# Patient Record
Sex: Female | Born: 1945 | Race: White | Hispanic: No | Marital: Single
Health system: Southern US, Community
[De-identification: ages and names within clinical notes are randomized; demographics above are authoritative.]

---

## 2004-12-12 ENCOUNTER — Inpatient Hospital Stay: Payer: Self-pay | Admitting: Internal Medicine

## 2005-11-06 ENCOUNTER — Ambulatory Visit: Payer: Self-pay | Admitting: Cardiovascular Disease

## 2006-12-10 ENCOUNTER — Ambulatory Visit: Payer: Self-pay | Admitting: Internal Medicine

## 2007-05-05 ENCOUNTER — Ambulatory Visit: Payer: Self-pay | Admitting: Internal Medicine

## 2007-05-25 ENCOUNTER — Ambulatory Visit: Payer: Self-pay | Admitting: Internal Medicine

## 2008-01-05 ENCOUNTER — Ambulatory Visit: Payer: Self-pay | Admitting: Cardiovascular Disease

## 2008-03-23 ENCOUNTER — Emergency Department: Payer: Self-pay | Admitting: Emergency Medicine

## 2008-03-23 ENCOUNTER — Other Ambulatory Visit: Payer: Self-pay

## 2009-07-06 ENCOUNTER — Emergency Department: Payer: Self-pay | Admitting: Emergency Medicine

## 2009-09-15 ENCOUNTER — Emergency Department: Payer: Self-pay | Admitting: Emergency Medicine

## 2009-09-22 ENCOUNTER — Emergency Department: Payer: Self-pay | Admitting: Emergency Medicine

## 2009-11-08 ENCOUNTER — Emergency Department: Payer: Self-pay | Admitting: Emergency Medicine

## 2009-12-13 ENCOUNTER — Ambulatory Visit: Payer: Self-pay | Admitting: Internal Medicine

## 2010-01-02 ENCOUNTER — Inpatient Hospital Stay: Payer: Self-pay | Admitting: Internal Medicine

## 2010-02-10 ENCOUNTER — Ambulatory Visit: Payer: Self-pay | Admitting: Internal Medicine

## 2010-05-07 ENCOUNTER — Emergency Department: Payer: Self-pay | Admitting: Emergency Medicine

## 2010-06-07 ENCOUNTER — Emergency Department: Payer: Self-pay | Admitting: Emergency Medicine

## 2010-06-09 ENCOUNTER — Inpatient Hospital Stay: Payer: Self-pay | Admitting: Specialist

## 2010-10-08 ENCOUNTER — Inpatient Hospital Stay: Payer: Self-pay | Admitting: Internal Medicine

## 2010-10-19 ENCOUNTER — Emergency Department: Payer: Self-pay | Admitting: Emergency Medicine

## 2011-04-01 ENCOUNTER — Inpatient Hospital Stay: Payer: Self-pay | Admitting: Internal Medicine

## 2011-04-05 ENCOUNTER — Inpatient Hospital Stay: Payer: Self-pay | Admitting: Internal Medicine

## 2011-05-16 ENCOUNTER — Inpatient Hospital Stay: Payer: Self-pay | Admitting: *Deleted

## 2011-06-04 ENCOUNTER — Inpatient Hospital Stay: Payer: Self-pay | Admitting: Internal Medicine

## 2011-07-21 ENCOUNTER — Emergency Department: Payer: Self-pay | Admitting: Emergency Medicine

## 2011-08-28 ENCOUNTER — Emergency Department: Payer: Self-pay | Admitting: Emergency Medicine

## 2011-11-23 ENCOUNTER — Observation Stay: Payer: Self-pay | Admitting: Internal Medicine

## 2011-12-28 IMAGING — CR DG HIP COMPLETE 2+V*L*
1 series · 2 of 2 positions shown · non-contrast
Comparison: none

REASON FOR EXAM: fall injury
COMMENTS:

[Series 1: view not recorded · 0.17mm/px · 2 of 2 slices shown]
[im 1/2]
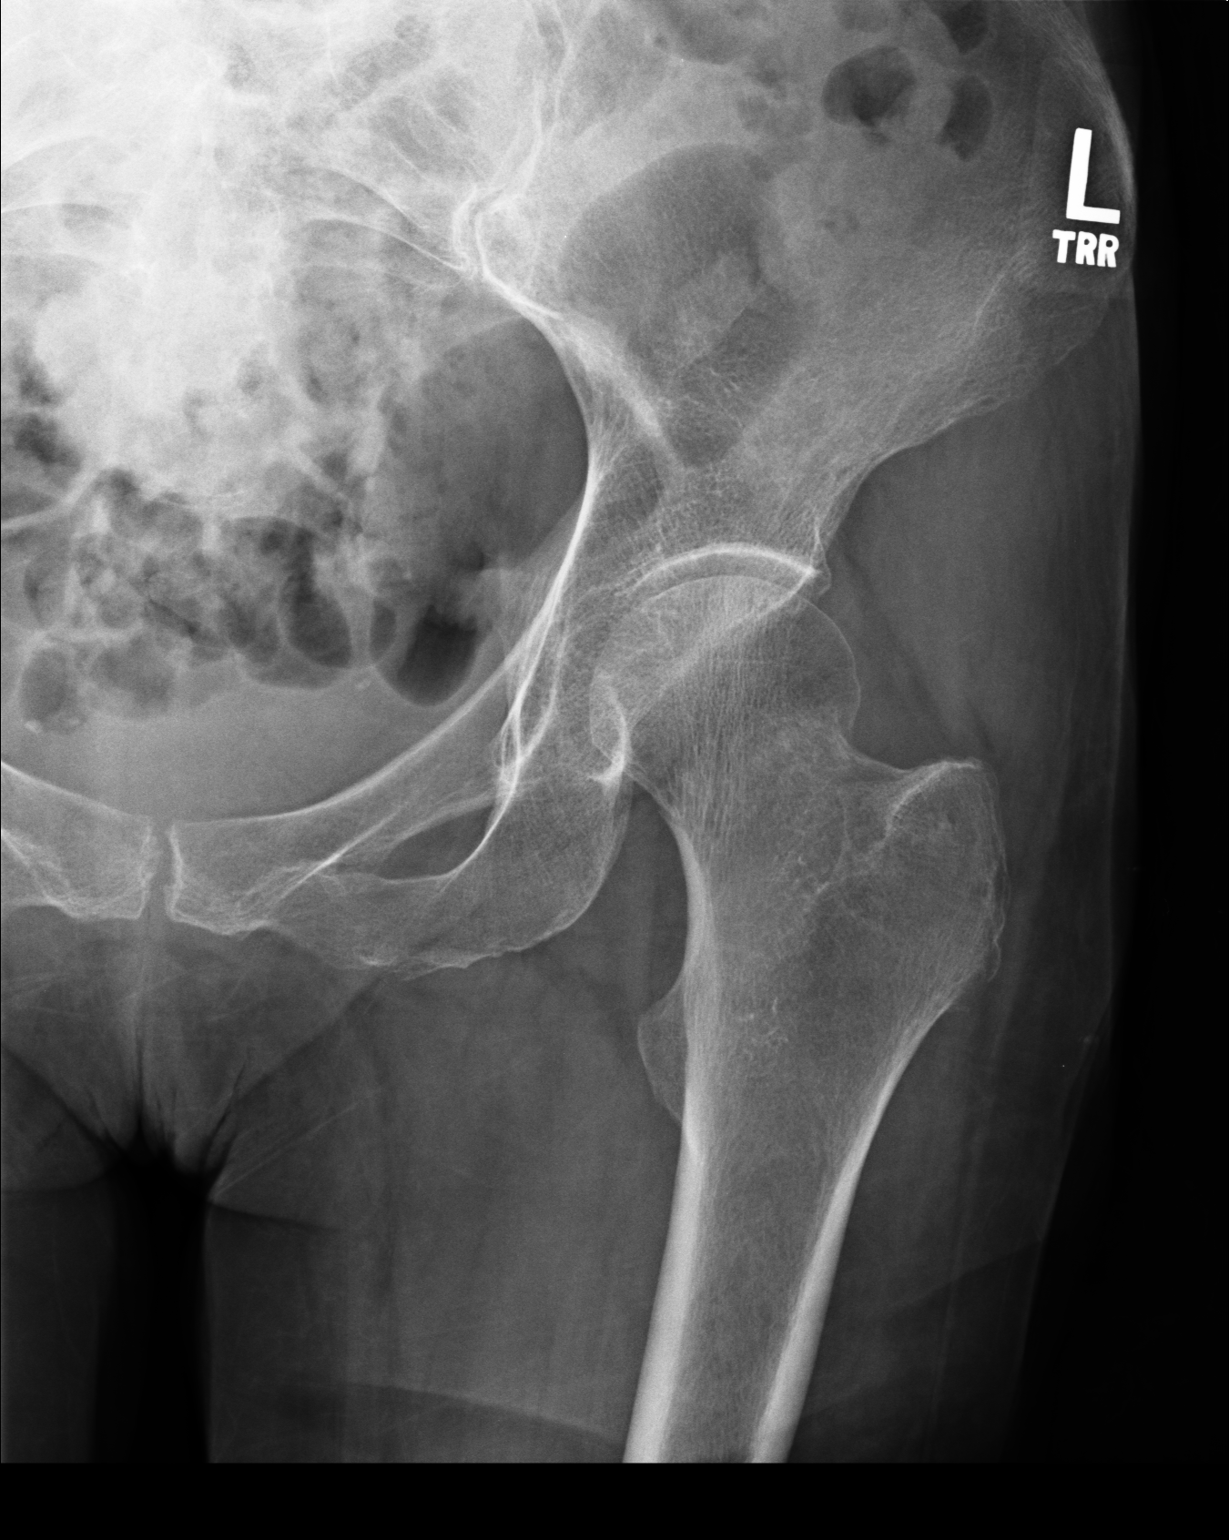
[im 2/2]
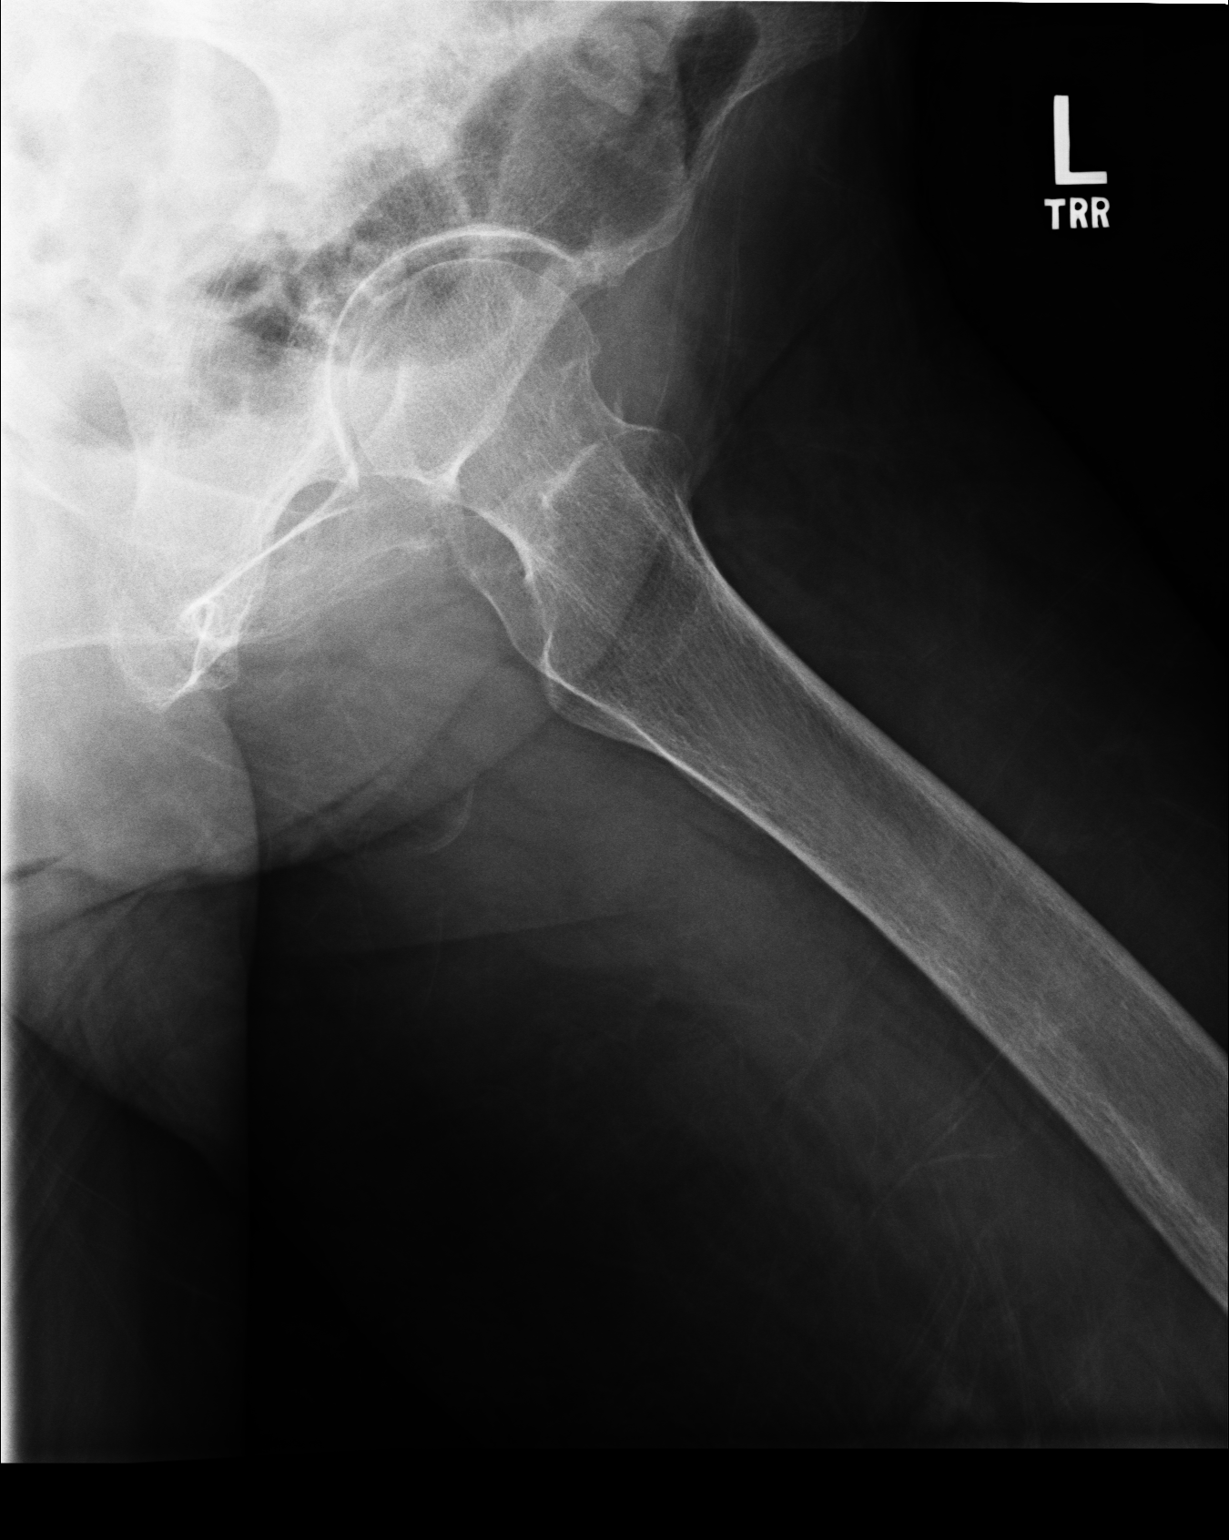

[2 of 2 positions shown; findings below may reference images not displayed]

PROCEDURE:     DXR - DXR HIP LEFT COMPLETE  - June 04, 2011 [DATE]

RESULT:     There is slight irregularity of the subcapital portion of the
femoral neck. The possibility of a minimal impaction-type fracture cannot be
excluded. If symptomatology persists, followup examination by MR or bone
scan is recommended. The hip joint space is well-maintained.
IMPRESSION: Possible nondisplaced femoral neck fracture. Followup examination is
recommended if symptomatology persists.

## 2011-12-28 IMAGING — CR DG LUMBAR SPINE 2-3V
1 series · 3 of 3 positions shown · non-contrast
Comparison: none

REASON FOR EXAM: fracture
COMMENTS:

[Series 1: view not recorded · 0.17mm/px · 3 of 3 slices shown]
[im 1/3]
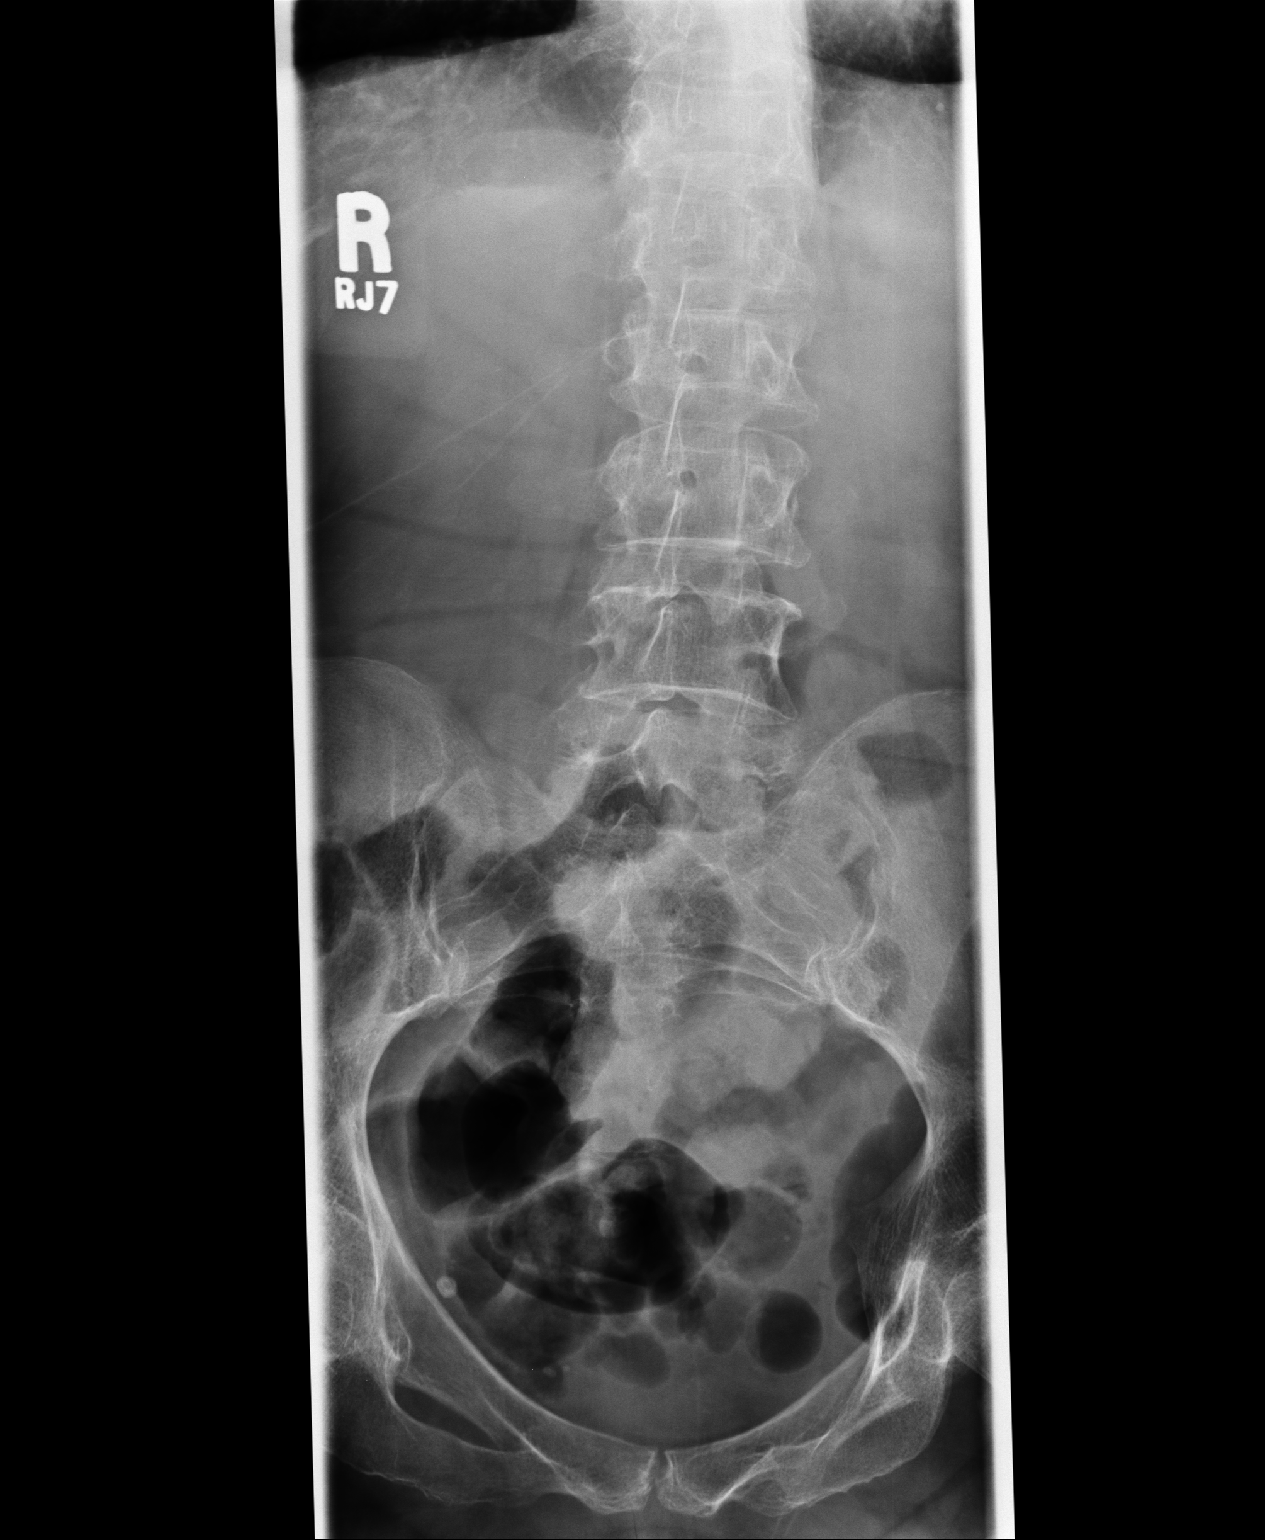
[im 2/3]
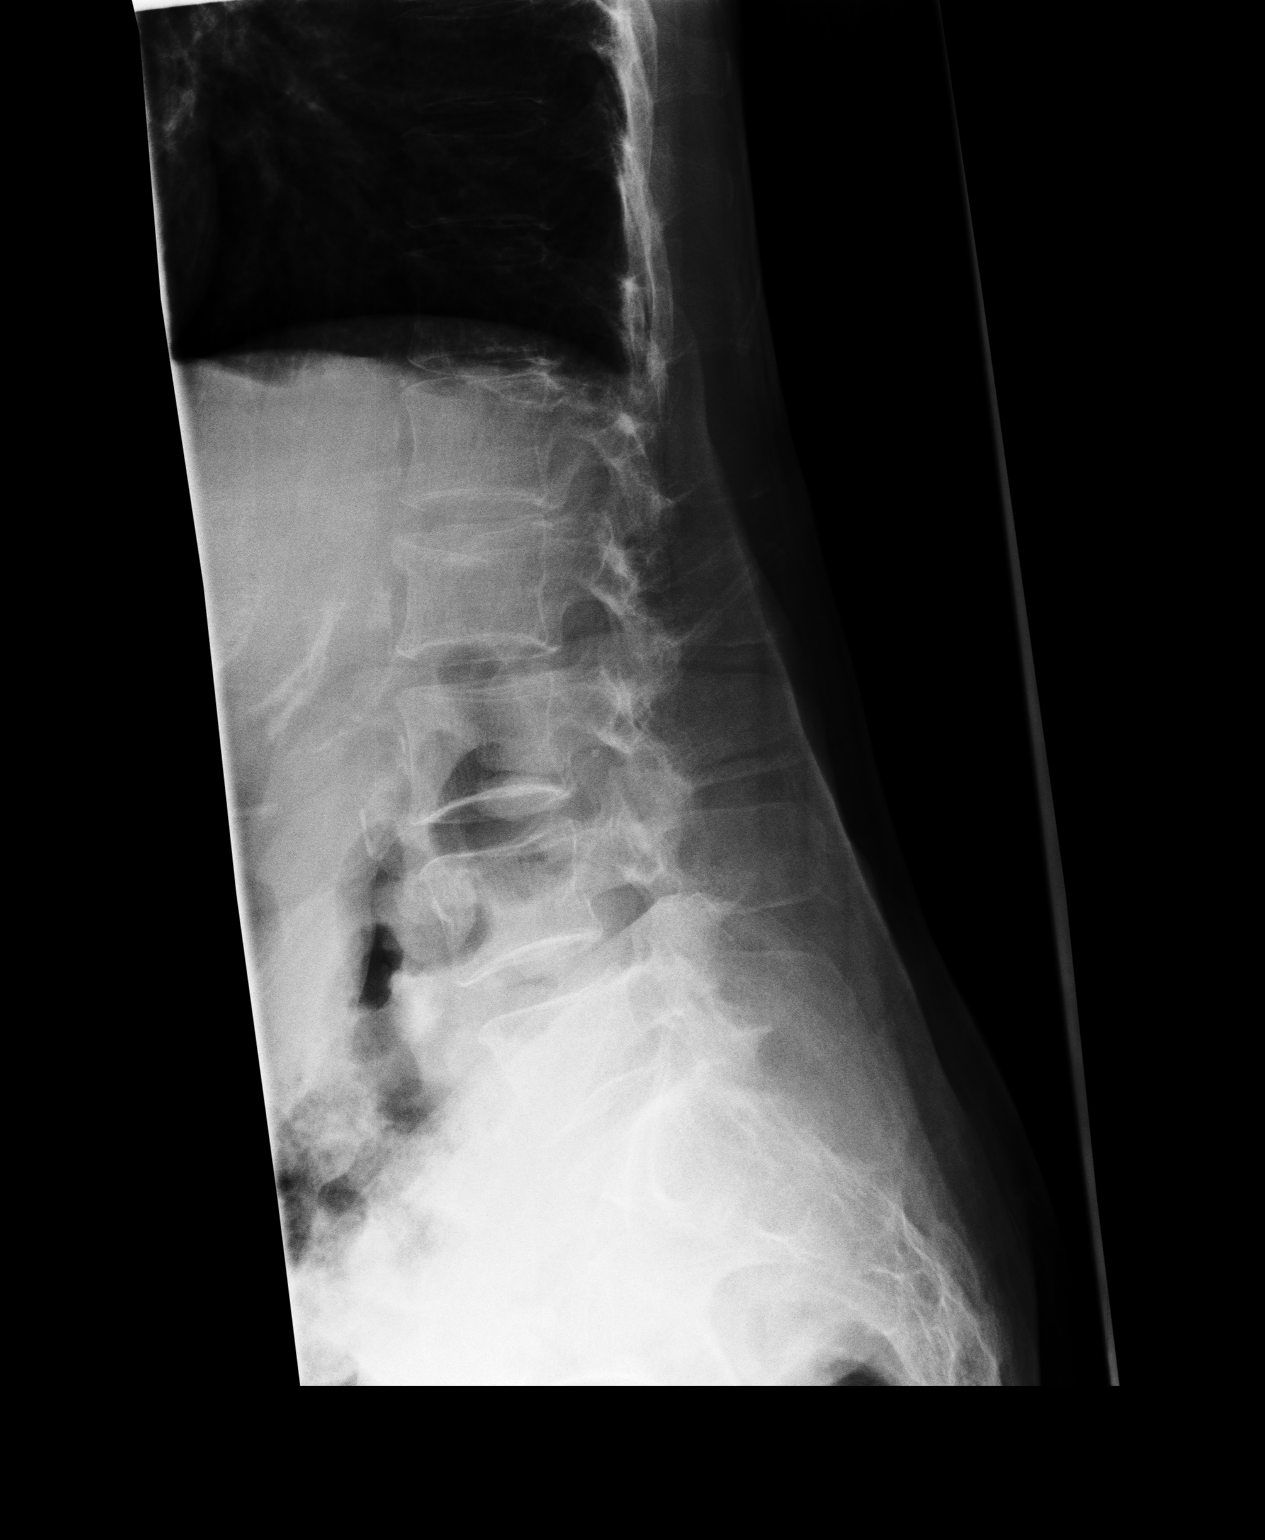
[im 3/3]
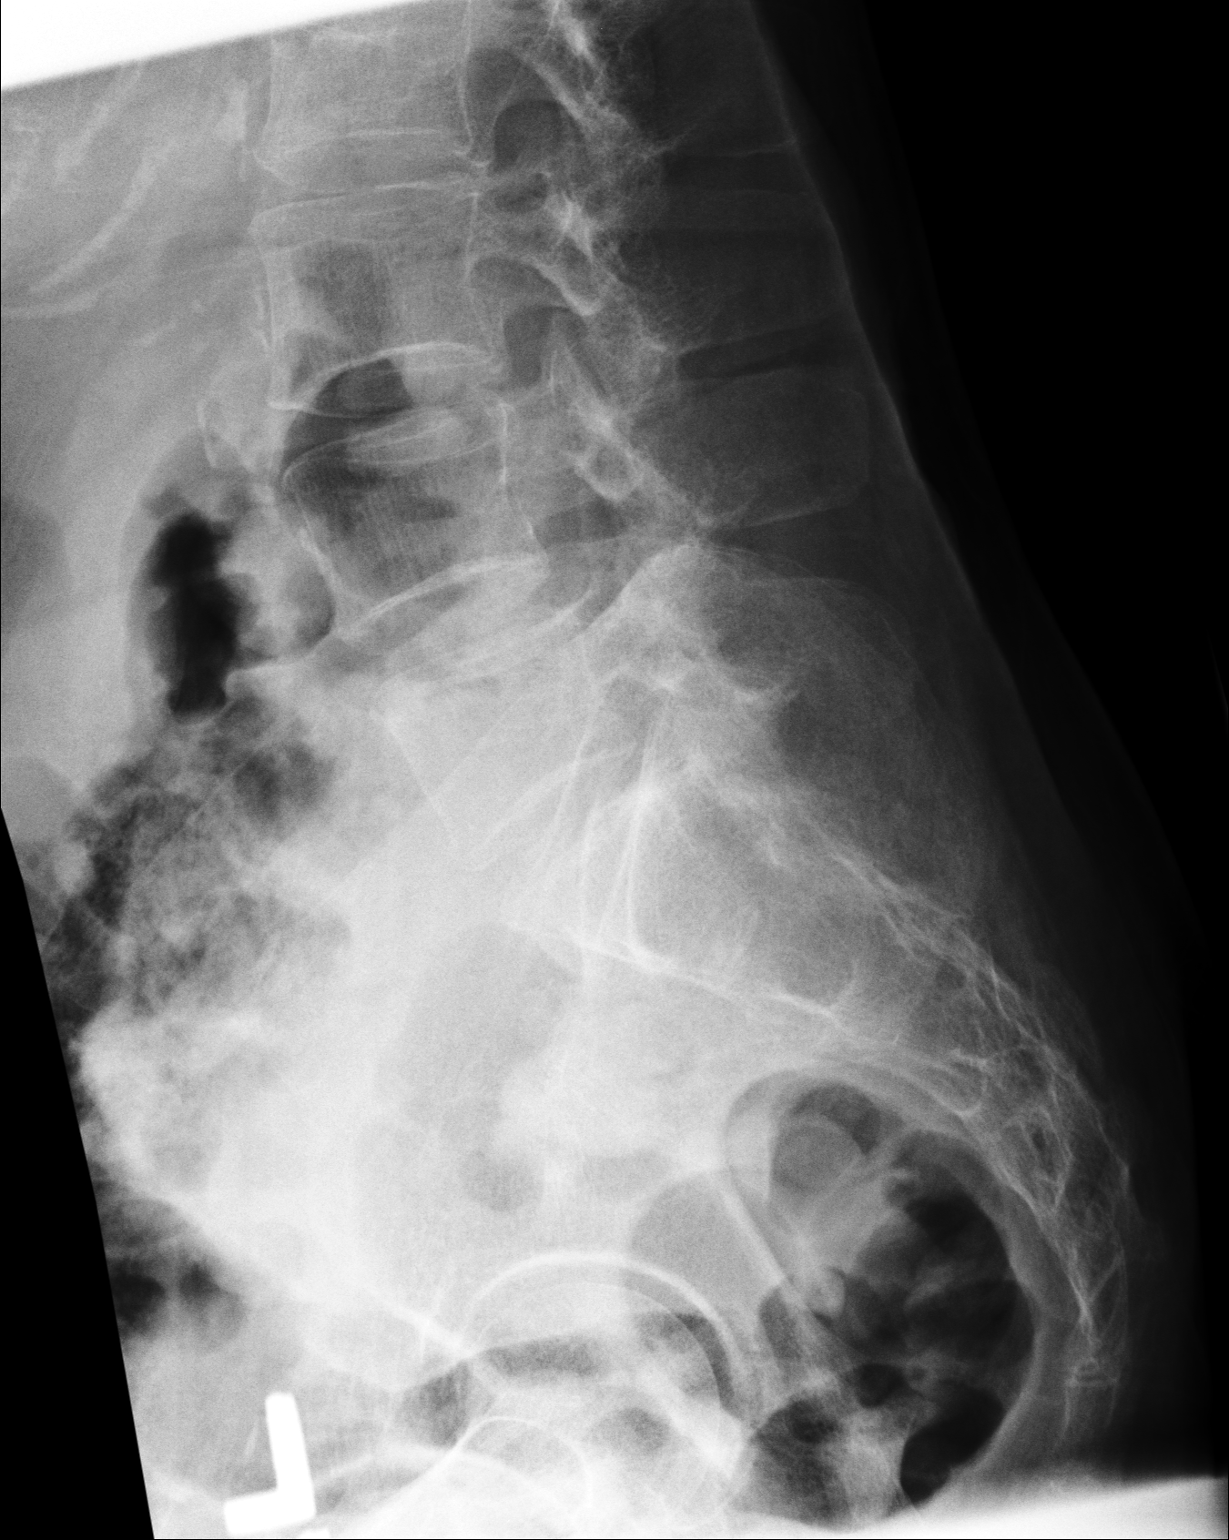

[3 of 3 positions shown; findings below may reference images not displayed]

PROCEDURE:     DXR - DXR LUMBAR SPINE AP AND LATERAL  - June 04, 2011  [DATE]

RESULT:     Comparison is made to a prior exam of 05/05/2007. The current
exam shows slight irregularity of the anterior/superior margin of L2. The
possibility of a minimal compression fracture at L2 cannot be excluded on
the basis of this exam. This could be further evaluated by lumbar CT if such
is clinically indicated. The vertebral body heights otherwise are
well-maintained. The vertebral body alignment is normal. The intervertebral
disc spaces are normal in appearance. The pedicles are bilaterally intact.
IMPRESSION: Possible slight compression deformity at L2. This could be better evaluated
by CT if such is clinically indicated.

## 2012-01-20 ENCOUNTER — Inpatient Hospital Stay: Payer: Self-pay | Admitting: Internal Medicine

## 2012-01-20 LAB — BASIC METABOLIC PANEL
Anion Gap: 4 — ABNORMAL LOW (ref 7–16)
BUN: 10 mg/dL (ref 7–18)
Calcium, Total: 8.9 mg/dL (ref 8.5–10.1)
Chloride: 103 mmol/L (ref 98–107)
Co2: 32 mmol/L (ref 21–32)
Creatinine: 0.62 mg/dL (ref 0.60–1.30)
Glucose: 86 mg/dL (ref 65–99)
Osmolality: 276 (ref 275–301)
Potassium: 4.2 mmol/L (ref 3.5–5.1)
Sodium: 139 mmol/L (ref 136–145)

## 2012-01-20 LAB — CBC WITH DIFFERENTIAL/PLATELET
Basophil #: 0.1 10*3/uL (ref 0.0–0.1)
Basophil %: 0.8 %
Eosinophil #: 0.1 10*3/uL (ref 0.0–0.7)
Eosinophil %: 1.7 %
HCT: 38.1 % (ref 35.0–47.0)
HGB: 12.8 g/dL (ref 12.0–16.0)
Lymphocyte #: 2.6 10*3/uL (ref 1.0–3.6)
Lymphocyte %: 41.4 %
MCH: 32 pg (ref 26.0–34.0)
MCHC: 33.6 g/dL (ref 32.0–36.0)
MCV: 95 fL (ref 80–100)
Monocyte #: 0.6 10*3/uL (ref 0.0–0.7)
Monocyte %: 8.8 %
Neutrophil #: 2.9 10*3/uL (ref 1.4–6.5)
Neutrophil %: 47.3 %
Platelet: 293 10*3/uL (ref 150–440)
RBC: 4.01 10*6/uL (ref 3.80–5.20)
RDW: 14.2 % (ref 11.5–14.5)
WBC: 6.2 10*3/uL (ref 3.6–11.0)

## 2012-01-20 LAB — THEOPHYLLINE LEVEL: Theophylline: <2 ug/mL — ABNORMAL LOW (ref 10.0–20.0)

## 2012-01-21 LAB — URINALYSIS, COMPLETE
Bacteria: NONE SEEN
Blood: NEGATIVE
Glucose,UR: NEGATIVE mg/dL (ref 0–75)
Leukocyte Esterase: NEGATIVE
Nitrite: NEGATIVE
Ph: 6 (ref 4.5–8.0)
Protein: NEGATIVE
Specific Gravity: 1.018 (ref 1.003–1.030)
WBC UR: <1 /HPF (ref 0–5)

## 2012-01-26 LAB — CULTURE, BLOOD (SINGLE)

## 2012-02-26 ENCOUNTER — Inpatient Hospital Stay: Payer: Self-pay | Admitting: Internal Medicine

## 2012-02-26 LAB — CBC
HGB: 12.8 g/dL (ref 12.0–16.0)
MCH: 31.5 pg (ref 26.0–34.0)
MCV: 95 fL (ref 80–100)
Platelet: 215 10*3/uL (ref 150–440)
RBC: 4.06 10*6/uL (ref 3.80–5.20)
WBC: 3.9 10*3/uL (ref 3.6–11.0)

## 2012-02-26 LAB — COMPREHENSIVE METABOLIC PANEL
Albumin: 3.6 g/dL (ref 3.4–5.0)
Anion Gap: 7 (ref 7–16)
BUN: 6 mg/dL — ABNORMAL LOW (ref 7–18)
Calcium, Total: 8.9 mg/dL (ref 8.5–10.1)
Chloride: 104 mmol/L (ref 98–107)
Co2: 32 mmol/L (ref 21–32)
EGFR (Non-African Amer.): >60
Potassium: 4.4 mmol/L (ref 3.5–5.1)
SGOT(AST): 15 U/L (ref 15–37)
SGPT (ALT): 15 U/L
Sodium: 143 mmol/L (ref 136–145)
Total Protein: 6.3 g/dL — ABNORMAL LOW (ref 6.4–8.2)

## 2012-02-26 LAB — MAGNESIUM: Magnesium: 1.8 mg/dL

## 2012-02-26 LAB — THEOPHYLLINE LEVEL: Theophylline: <2 ug/mL — ABNORMAL LOW (ref 10.0–20.0)

## 2012-02-28 LAB — CBC WITH DIFFERENTIAL/PLATELET
Basophil #: 0 10*3/uL (ref 0.0–0.1)
Basophil %: 0.3 %
Eosinophil %: 0 %
HCT: 34.9 % — ABNORMAL LOW (ref 35.0–47.0)
HGB: 11.7 g/dL — ABNORMAL LOW (ref 12.0–16.0)
Lymphocyte %: 5.7 %
MCHC: 33.5 g/dL (ref 32.0–36.0)
MCV: 94 fL (ref 80–100)
Neutrophil #: 11.4 10*3/uL — ABNORMAL HIGH (ref 1.4–6.5)
Neutrophil %: 92.2 %
RBC: 3.72 10*6/uL — ABNORMAL LOW (ref 3.80–5.20)
RDW: 13.6 % (ref 11.5–14.5)

## 2012-02-28 LAB — MAGNESIUM: Magnesium: 1.7 mg/dL — ABNORMAL LOW

## 2012-02-29 LAB — BASIC METABOLIC PANEL
Anion Gap: 9 (ref 7–16)
BUN: 13 mg/dL (ref 7–18)
Calcium, Total: 8.5 mg/dL (ref 8.5–10.1)
Chloride: 102 mmol/L (ref 98–107)
Creatinine: 0.59 mg/dL — ABNORMAL LOW (ref 0.60–1.30)
EGFR (African American): >60
EGFR (Non-African Amer.): >60
Glucose: 131 mg/dL — ABNORMAL HIGH (ref 65–99)
Potassium: 4.4 mmol/L (ref 3.5–5.1)

## 2012-02-29 LAB — CBC WITH DIFFERENTIAL/PLATELET
Basophil %: 0.1 %
Eosinophil #: 0 10*3/uL (ref 0.0–0.7)
Eosinophil %: 0 %
HGB: 12.1 g/dL (ref 12.0–16.0)
Lymphocyte %: 5.1 %
MCV: 95 fL (ref 80–100)
Monocyte #: 0.2 10*3/uL (ref 0.0–0.7)
Neutrophil #: 12.7 10*3/uL — ABNORMAL HIGH (ref 1.4–6.5)
Neutrophil %: 93.2 %
RBC: 3.91 10*6/uL (ref 3.80–5.20)
RDW: 13.3 % (ref 11.5–14.5)
WBC: 13.7 10*3/uL — ABNORMAL HIGH (ref 3.6–11.0)

## 2012-02-29 LAB — MAGNESIUM: Magnesium: 1.9 mg/dL

## 2012-03-01 LAB — LIPID PANEL
Cholesterol: 159 mg/dL (ref 0–200)
Ldl Cholesterol, Calc: 72 mg/dL (ref 0–100)
Triglycerides: 99 mg/dL (ref 0–200)
VLDL Cholesterol, Calc: 20 mg/dL (ref 5–40)

## 2012-03-01 LAB — WBC: WBC: 8.8 10*3/uL (ref 3.6–11.0)

## 2012-05-29 ENCOUNTER — Emergency Department: Payer: Self-pay | Admitting: Internal Medicine

## 2012-05-29 LAB — BASIC METABOLIC PANEL
Calcium, Total: 9 mg/dL (ref 8.5–10.1)
Chloride: 104 mmol/L (ref 98–107)
Co2: 28 mmol/L (ref 21–32)
Creatinine: 0.77 mg/dL (ref 0.60–1.30)
EGFR (African American): >60
EGFR (Non-African Amer.): >60
Glucose: 85 mg/dL (ref 65–99)
Osmolality: 282 (ref 275–301)
Potassium: 3.6 mmol/L (ref 3.5–5.1)

## 2012-05-29 LAB — HEPATIC FUNCTION PANEL A (ARMC)
Alkaline Phosphatase: 74 U/L (ref 50–136)
SGOT(AST): 17 U/L (ref 15–37)
SGPT (ALT): 17 U/L

## 2012-05-29 LAB — TROPONIN I: Troponin-I: <0.02 ng/mL

## 2012-05-29 LAB — CBC
HGB: 14.6 g/dL (ref 12.0–16.0)
MCH: 31.1 pg (ref 26.0–34.0)
MCHC: 33.6 g/dL (ref 32.0–36.0)
Platelet: 304 10*3/uL (ref 150–440)
RBC: 4.69 10*6/uL (ref 3.80–5.20)

## 2012-06-20 ENCOUNTER — Ambulatory Visit: Payer: Self-pay | Admitting: Internal Medicine

## 2012-11-17 ENCOUNTER — Ambulatory Visit: Payer: Self-pay

## 2012-12-06 ENCOUNTER — Emergency Department: Payer: Self-pay | Admitting: Emergency Medicine

## 2012-12-06 LAB — BASIC METABOLIC PANEL
Anion Gap: 3 — ABNORMAL LOW (ref 7–16)
BUN: 16 mg/dL (ref 7–18)
Calcium, Total: 8.5 mg/dL (ref 8.5–10.1)
Creatinine: 0.69 mg/dL (ref 0.60–1.30)
EGFR (Non-African Amer.): >60
Osmolality: 280 (ref 275–301)
Potassium: 3.9 mmol/L (ref 3.5–5.1)

## 2012-12-06 LAB — CBC
HGB: 12.3 g/dL (ref 12.0–16.0)
MCH: 31 pg (ref 26.0–34.0)
MCHC: 33.4 g/dL (ref 32.0–36.0)
MCV: 93 fL (ref 80–100)
Platelet: 195 10*3/uL (ref 150–440)
RBC: 3.98 10*6/uL (ref 3.80–5.20)
RDW: 14.1 % (ref 11.5–14.5)
WBC: 7.8 10*3/uL (ref 3.6–11.0)

## 2012-12-06 LAB — CK TOTAL AND CKMB (NOT AT ARMC)
CK, Total: 159 U/L (ref 21–215)
CK, Total: 139 U/L (ref 21–215)

## 2012-12-06 LAB — TROPONIN I: Troponin-I: <0.02 ng/mL

## 2012-12-22 IMAGING — CR DG CHEST 2V
1 series · 2 of 2 positions shown · non-contrast
Comparison: none

REASON FOR EXAM: SOB
COMMENTS:

[Series 1: pa · 0.17mm/px · 2 of 2 slices shown]
[im 1/2]
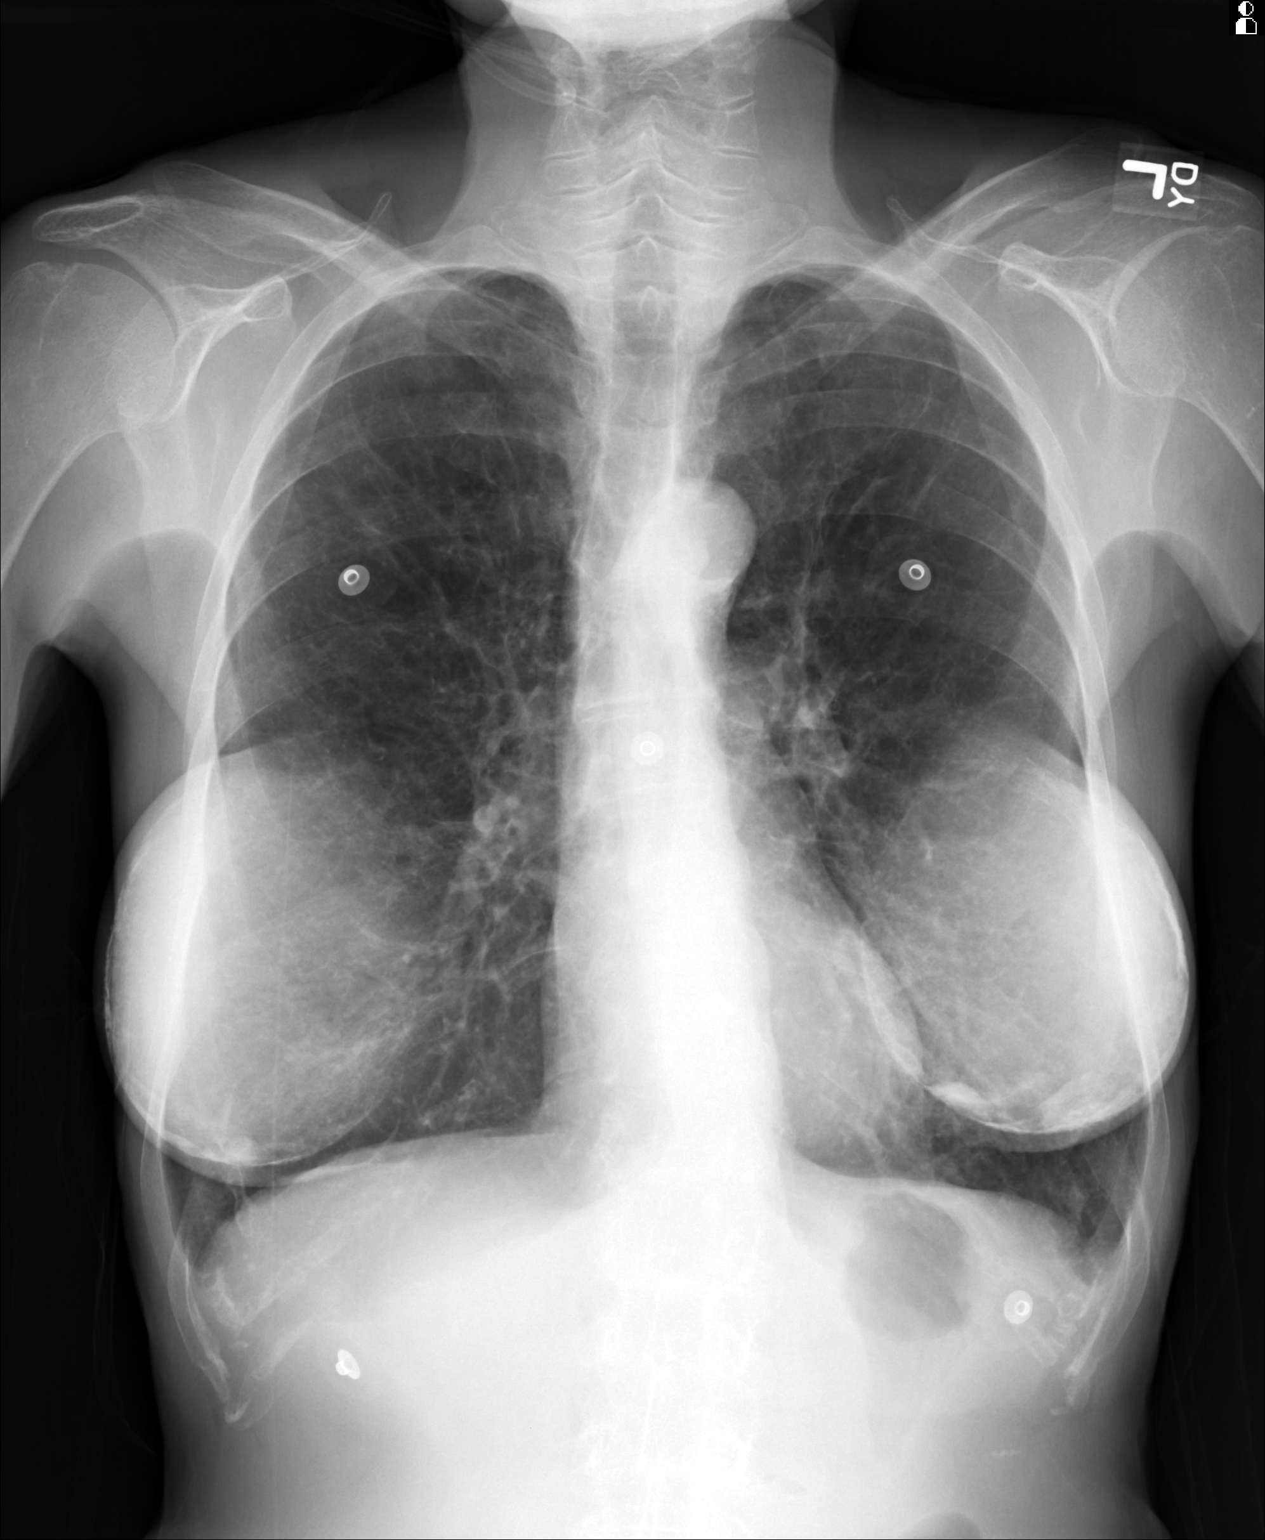
[im 2/2]
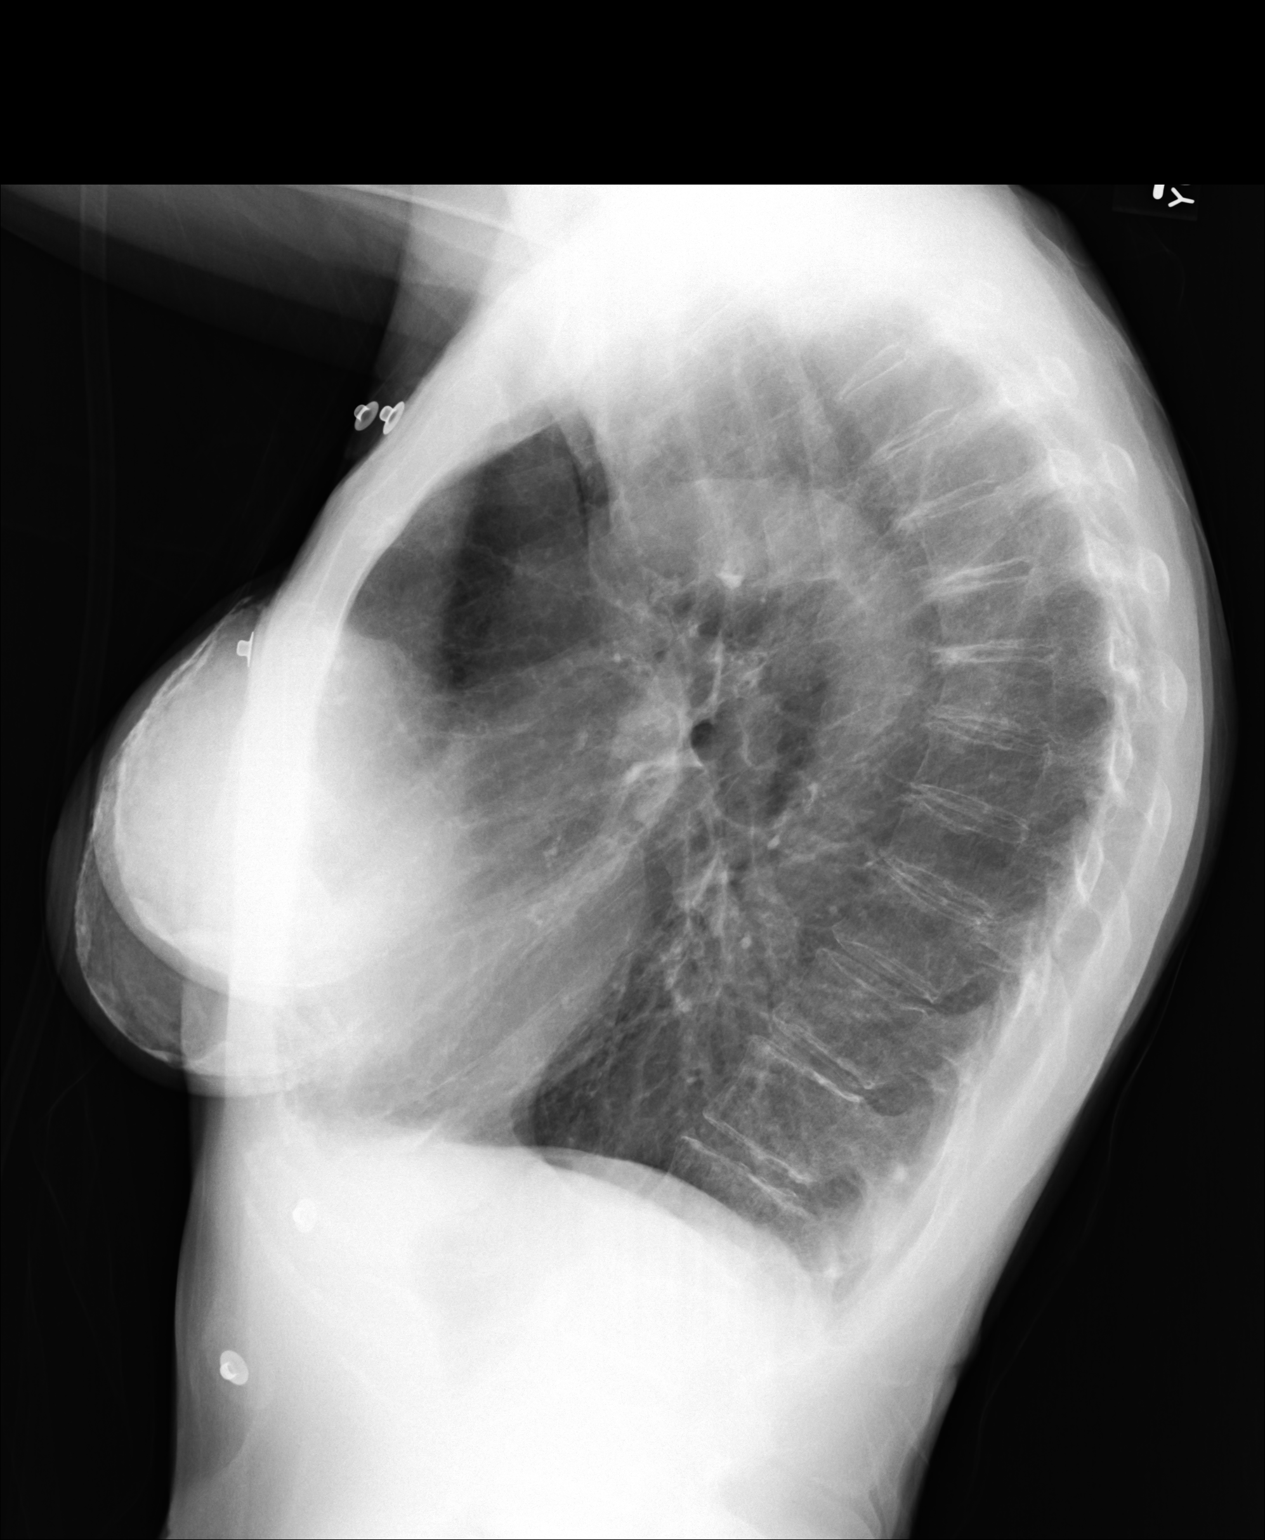

[2 of 2 positions shown; findings below may reference images not displayed]

PROCEDURE:     DXR - DXR CHEST PA (OR AP) AND LATERAL  - May 29, 2012  [DATE]

RESULT:     Comparison is made to the study 26 February, 2012.

Old calcified breast implants are present. The lungs are hyperinflated
consistent with COPD. Underlying chronic inflammatory or fibrotic changes
are present without edema, infiltrate, effusion or pneumothorax. The heart
size is normal.
IMPRESSION: 1. COPD.
2. Chronic inflammatory changes without acute cardiopulmonary disease
evident.

[REDACTED]

## 2013-01-01 LAB — BASIC METABOLIC PANEL
Anion Gap: 2 — ABNORMAL LOW (ref 7–16)
BUN: 10 mg/dL (ref 7–18)
Calcium, Total: 8.1 mg/dL — ABNORMAL LOW (ref 8.5–10.1)
Co2: 32 mmol/L (ref 21–32)
Creatinine: 0.71 mg/dL (ref 0.60–1.30)
EGFR (Non-African Amer.): >60
Glucose: 93 mg/dL (ref 65–99)
Potassium: 3.9 mmol/L (ref 3.5–5.1)

## 2013-01-01 LAB — CBC
HGB: 10.7 g/dL — ABNORMAL LOW (ref 12.0–16.0)
MCH: 30.4 pg (ref 26.0–34.0)
Platelet: 177 10*3/uL (ref 150–440)
RDW: 14.6 % — ABNORMAL HIGH (ref 11.5–14.5)

## 2013-01-02 ENCOUNTER — Observation Stay: Payer: Self-pay | Admitting: Internal Medicine

## 2013-01-05 LAB — PLATELET COUNT: Platelet: 182 10*3/uL (ref 150–440)

## 2013-01-07 ENCOUNTER — Ambulatory Visit: Payer: Self-pay | Admitting: Internal Medicine

## 2013-01-08 ENCOUNTER — Observation Stay: Payer: Self-pay | Admitting: Internal Medicine

## 2013-01-08 LAB — CBC
HCT: 35.8 % (ref 35.0–47.0)
HGB: 12.1 g/dL (ref 12.0–16.0)
MCHC: 33.6 g/dL (ref 32.0–36.0)
MCV: 92 fL (ref 80–100)
RBC: 3.88 10*6/uL (ref 3.80–5.20)
RDW: 14.1 % (ref 11.5–14.5)

## 2013-01-08 LAB — BASIC METABOLIC PANEL
BUN: 18 mg/dL (ref 7–18)
Calcium, Total: 8 mg/dL — ABNORMAL LOW (ref 8.5–10.1)
Chloride: 104 mmol/L (ref 98–107)
Creatinine: 0.7 mg/dL (ref 0.60–1.30)
EGFR (African American): >60
Potassium: 3.4 mmol/L — ABNORMAL LOW (ref 3.5–5.1)
Sodium: 140 mmol/L (ref 136–145)

## 2013-01-08 LAB — TROPONIN I: Troponin-I: <0.02 ng/mL

## 2013-01-08 LAB — CK TOTAL AND CKMB (NOT AT ARMC): CK, Total: 53 U/L (ref 21–215)

## 2013-01-09 LAB — LIPID PANEL
Cholesterol: 133 mg/dL (ref 0–200)
Triglycerides: 55 mg/dL (ref 0–200)

## 2013-01-13 IMAGING — CT CT HEAD WITHOUT CONTRAST
1 series · 16 of 30 positions shown, 20 images · non-contrast
Comparison: none

REASON FOR EXAM: CR [id number] Syncope Dizziness Left Sided Weakness
COMMENTS:

PROCEDURE:     KCT - KCT HEAD WITHOUT CONTRAST  - June 20, 2012  [DATE]
RESULT:     The thecal syncope.
Comparison Study: Prior CT of 01/10/2010.

[Series 2: 1 soft tissue · axial · 0.39mm/px · z∈[-38,+102]mm · 16 of 32 slices shown, 20 images]
[im 2/32  brain]
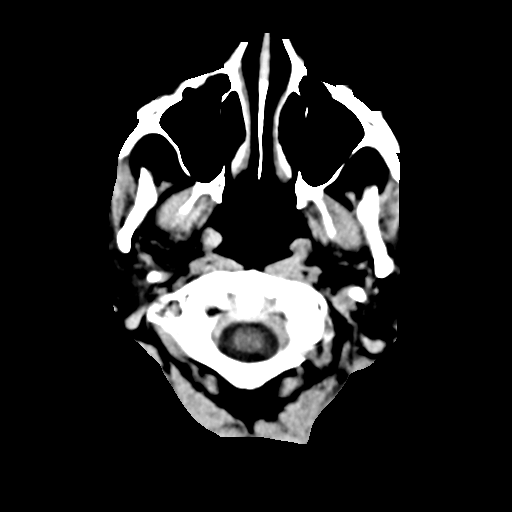
[im 2/32  bone]
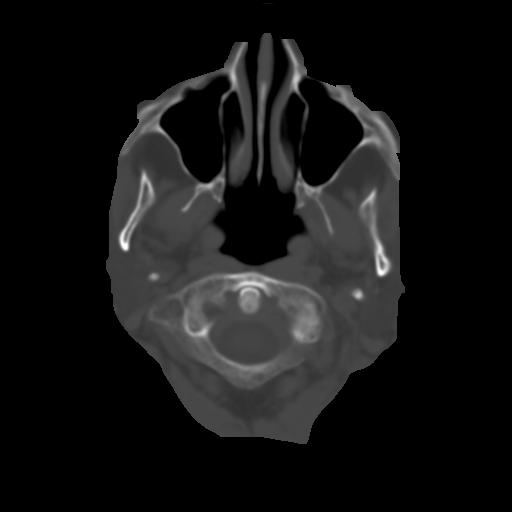
[im 4/32  brain]
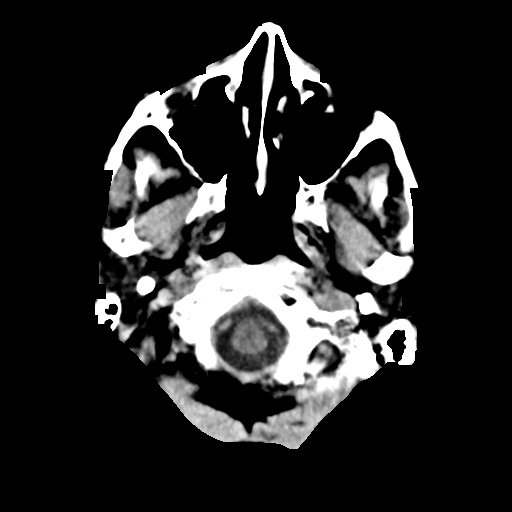
[im 6/32  brain]
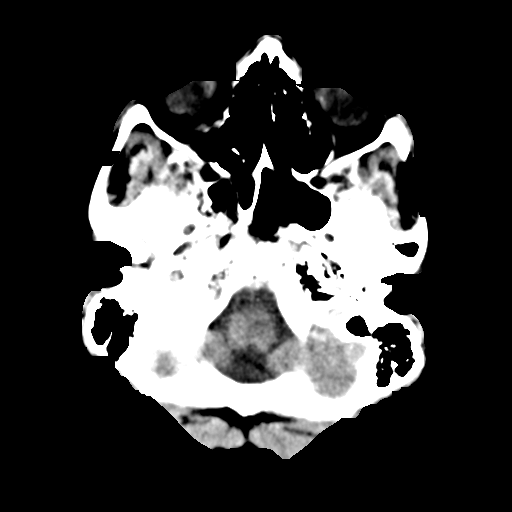
[im 8/32  brain]
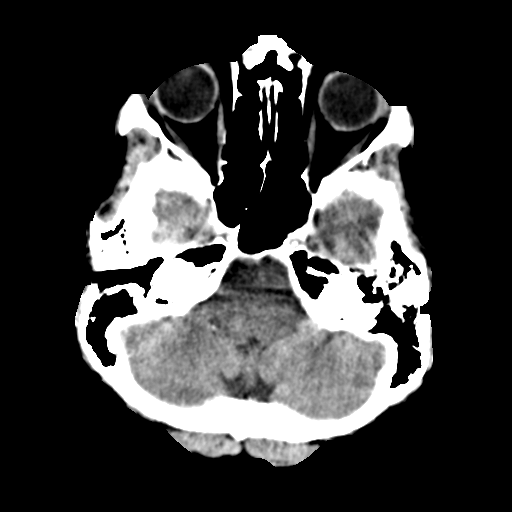
[im 9/32  brain]
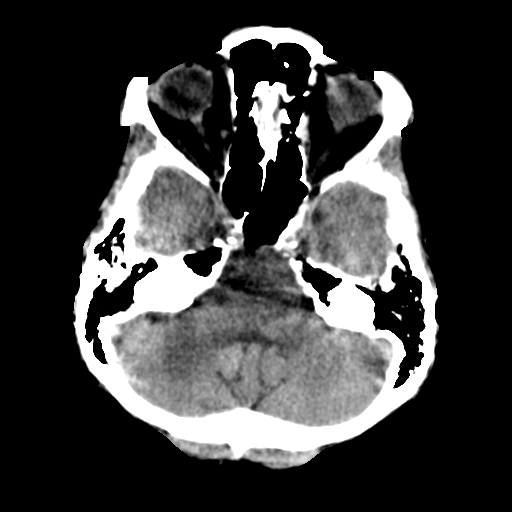
[im 9/32  bone]
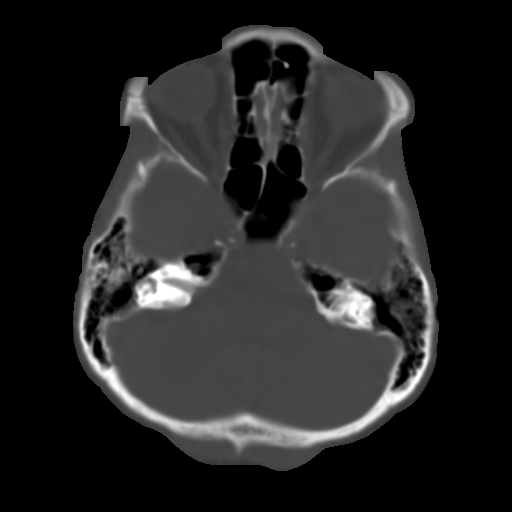
[im 11/32  brain]
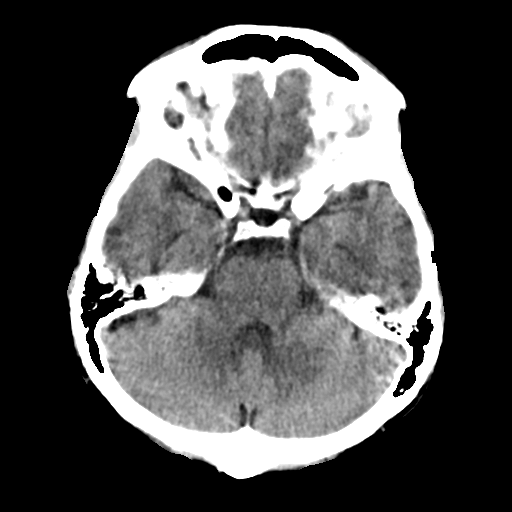
[im 13/32  brain]
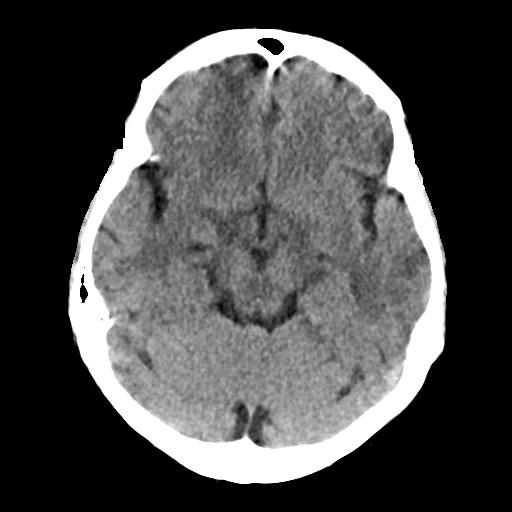
[im 15/32  brain]
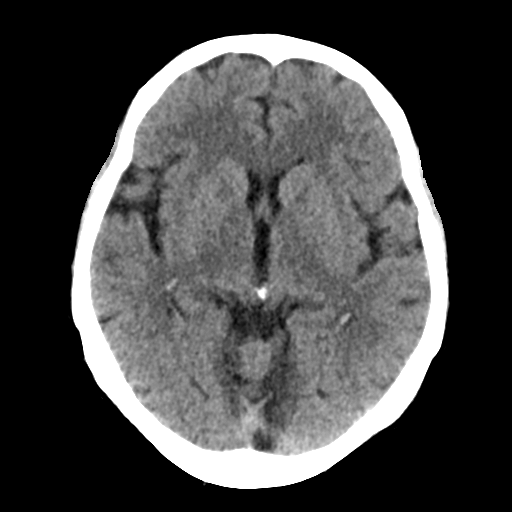
[im 17/32  brain]
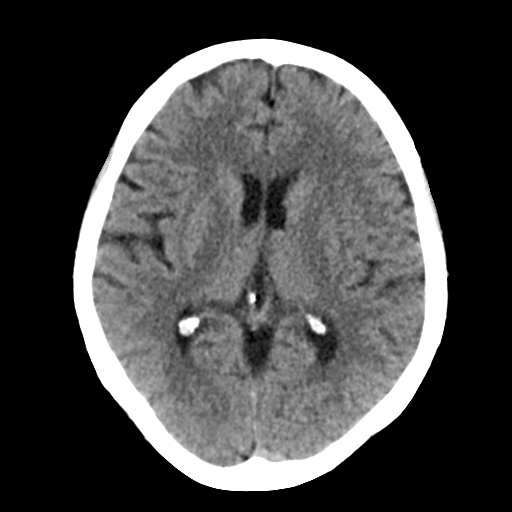
[im 17/32  bone]
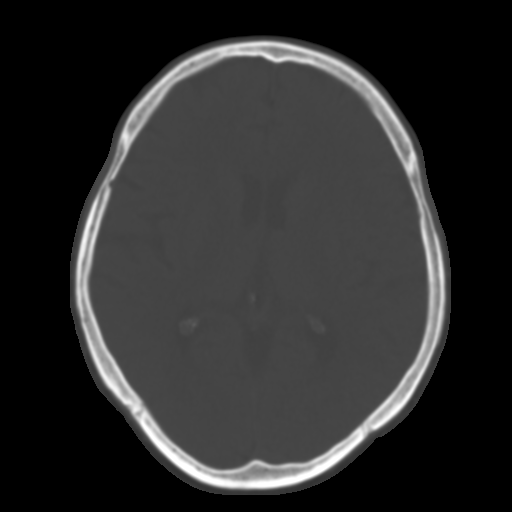
[im 19/32  brain]
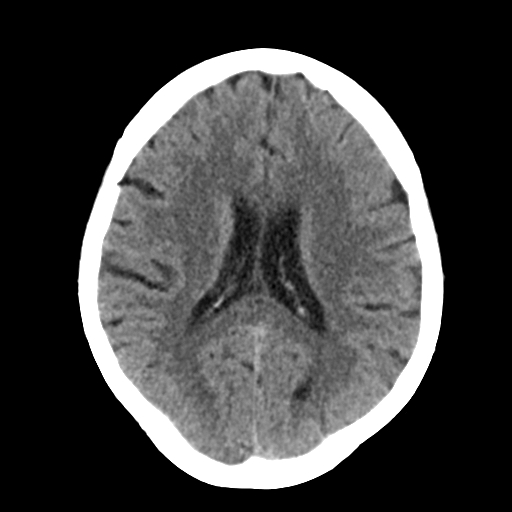
[im 21/32  brain]
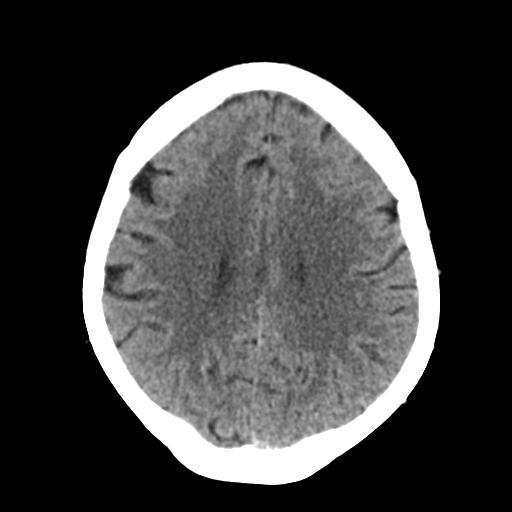
[im 23/32  brain]
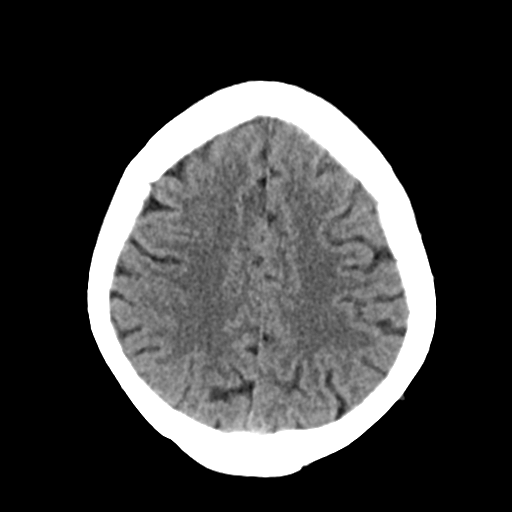
[im 24/32  brain]
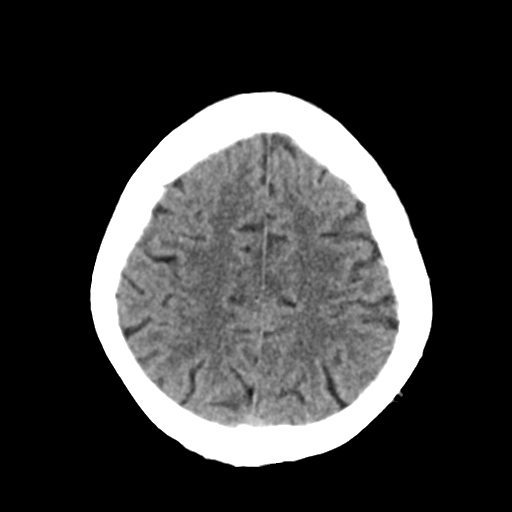
[im 24/32  bone]
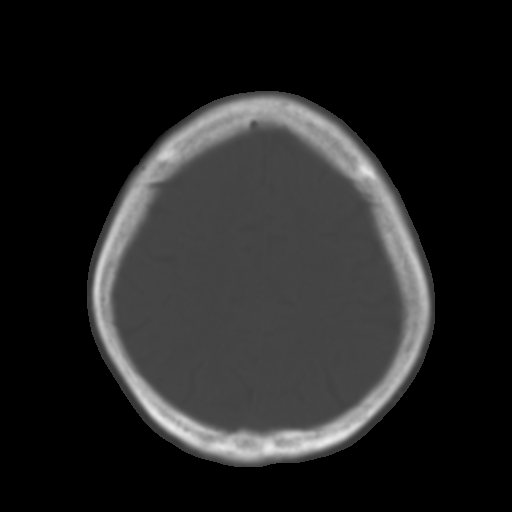
[im 26/32  brain]
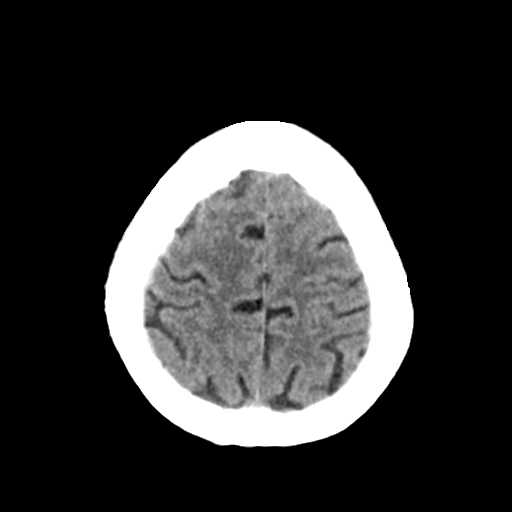
[im 28/32  brain]
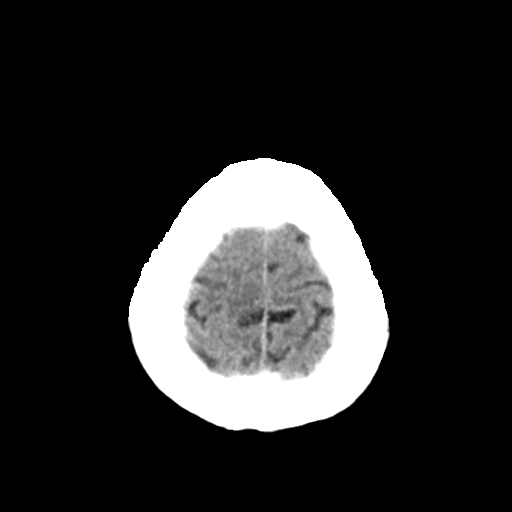
[im 30/32  brain]
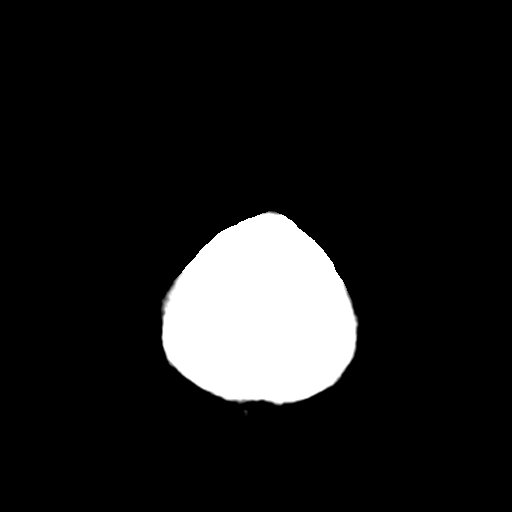

[16 of 30 positions shown; findings below may reference images not displayed]

FINDINGS: No mass, no hydrocephalus. No hemorrhage. Orbits and paranasal
sinuses are clear.
IMPRESSION: No acute abnormality.

## 2013-02-04 ENCOUNTER — Ambulatory Visit: Payer: Self-pay | Admitting: Internal Medicine

## 2013-10-07 DEATH — deceased

## 2015-03-29 NOTE — Discharge Summary (Signed)
PATIENT NAME:  Bridget RayaHUNT, Garlene N MR#:  [id number] DATE OF BIRTH:  1946/08/09  DATE OF ADMISSION:  01/08/2013 DATE OF DISCHARGE:  01/10/2013  PRIMARY CARE PHYSICIAN:  Dr. Freda MunroSaadat Khan.   CONSULTATIONS:  Palliative care, Dr. Harvie JuniorPhifer.  Cardiology, Dr. Gwen PoundsKowalski.   DISCHARGE DIAGNOSES: 1.  Atypical chronic chest pain.  2.  Chronic obstructive pulmonary disease.  3.  Hypertension.  4.  Hypothyroidism.   CONDITION:  Stable.   CODE STATUS:  DO NOT RESUSCITATE.   HOME MEDICATIONS:  [facility] reconciliation [facility].   DIET:  Low sodium diet.   ACTIVITY:  As tolerated.   FOLLOW-UP CARE:  Follow up with PCP within 1 to 2 weeks.  Follow up pain management physician as outpatient.   REASON FOR ADMISSION:  Chest pain.   HOSPITAL COURSE:   1.  The patient is a 69 year old Caucasian female with a history of end-stage COPD on home oxygen under care of hospice who has a history of chronic smoking, history of CAD, COPD, presented to the ED with chest pain which was severe, sharp, sternal chest pain.  It lasted about one hour.  The patient was admitted for atypical chest pain.  For details of the history and physical examination, please refer to the admission note dictated by Dr. Auburn BilberryShreyang Patel.  The patient's cardiac enzymes is negative.  EKG showed no acute abnormality.  Dr. Gwen PoundsKowalski evaluated the patient and suggested no cardiac work-up.  Dr. Harvie JuniorPhifer evaluated the patient, reviewed patient's medication list, suggested no change for medication.  The patient needs to follow up pain specialist as outpatient.  The patient continuously complains of chest pain all over.  She requested liquid morphine.   2.  The patient has end stage COPD which has been treated with patient's home medication prednisone and nebulizer.  The patient is clinically stable and will be discharged to home with home hospice care.  Discussed the patient's discharge plan with the case  manager.   TIME SPENT:  About 30 minutes.     ____________________________ Shaune PollackQing Conn Trombetta, MD qc:ea D: 01/10/2013 16:33:09 ET T: 01/11/2013 06:01:44 ET JOB#: [id number]  cc: Shaune PollackQing Ayvin Lipinski, MD, <Dictator> Shaune PollackQING Latissa Frick MD ELECTRONICALLY SIGNED 01/12/2013 18:34

## 2015-03-29 NOTE — Discharge Summary (Signed)
PATIENT NAME:  Bridget Crawford, Bridget Crawford MR#:  [id number] DATE OF BIRTH:  [birth year]  DATE OF ADMISSION:  01/02/2013 DATE OF DISCHARGE:  01/05/2013  ADMITTING DIAGNOSIS: Acute on chronic respiratory failure.  DISCHARGE DIAGNOSES:  1. Acute on chronic respiratory failure, end-stage chronic obstructive pulmonary disease, oxygen dependent, acute bronchitis.  2. Chronic obstructive pulmonary disease exacerbation.  3. Acute bronchitis.  4. Paroxysmal cough. 5. History of hypertension, hyperlipidemia, coronary artery disease, peripheral neuropathy, hyperthyroidism and ongoing tobacco abuse.   DISCHARGE CONDITION: Stable.  DISCHARGE MEDICATIONS: The patient is to resume her outpatient medications which are:  1. Daliresp 500 mcg p.o. daily.  2. Spiriva 18 mcg inhalation capsule one inhalation daily.  3. Theophylline extended-release 200 mg p.o. 1-1/2 tablets which would be 300 mg once a day.  4. Dulera 200 mcg/500 mcg 2 puffs twice daily.  5. Bactroban 2% topical cream to effected area twice a day as needed.  6. Buspirone 20 mg p.o. twice daily. 7. Bystolic 5 mg p.o. daily. 8. DuoNebs 0.5 mg/2.5 mg in 3 mL inhalation solution 3 mL 4 times daily. 9. Fentanyl 25 mcg transdermal film q. 72 hours. 10. Gabapentin 300 mg p.o. every 8 hours.  11. Ipratropium 500 mcg in 2.5 mL inhalation solution 2.5 mL every 6 hours. 12. Levothyroxine 50 mcg p.o. daily.  13. Lipitor 20 mg p.o. daily.  14. Methocarbamol 750 mg p.o. every 8 hours.  15. Norvasc 5 mg p.o. daily.  16. Omeprazole 40 mg p.o. daily.  17. Pristiq 100 mg p.o. 1-1/2 tablets once daily.  18. Albuterol CFC free 2 puffs every 4 to 6 hours as needed.  19. Toviaz 8 mg p.o. daily.  20. Trazodone 50 mg p.o. at bedtime as needed.  21. Zofran 4 mg every 8 hours as needed.  22. Pro Air HFA 2 puffs 4 times daily as needed. 23. Diazepam 20 mg 2 times a day. 24. Morphine 15 mg p.o. every 4 hours as needed.  25. Nicotrol oral inhaler 1 inhalation every 2 hours  as needed.  26. Levofloxacin 500 mg p.o. daily for 7 days.  27. Prednisone 60 mg p.o. once on the 31st of January 2014 then taper by 10 mg every 2 days until stopped.  28. Codeine sulfate 30 mg p.o. every 4 hours as needed for cough.  HOME OXYGEN: 3 liters of oxygen through nasal cannula.   DIET: 2 grams salt, low fat, low cholesterol, mechanical soft, frequent small meals.  FOLLOWUP APPOINTMENT: With Dr. Freda MunroSaadat Khan in 2 days after discharge.  CONSULTANTS: Care Management.  RADIOLOGIC STUDIES: Chest x-ray, PA and lateral, on 26th of January 2014, revealed COPD with slight interstitial lung disease. No definite acute abnormality evident. Borderline cardiomegaly was noted.   HISTORY OF PRESENT ILLNESS: The patient is a [age] year old Caucasian female with history of end-stage COPD who is being followed by hospice and oxygen dependent who presents to the hospital with complaints of shortness of breath as well as cough. Please refer to Dr. Riley Nearingarwish's admission note on the 27th of January 2014.   On arrival to the hospital, the patient's blood pressure was 118/62, respiration rate was 22, pulse was 70, temperature was 98.3 and oxygen saturation was 100% on oxygen and breathing treatments. Physical exam revealed prolonged expiratory phase, bilateral wheezing, mostly expiratory wheezing, she was using accessory muscles to breathe, no rales and few scattered rhonchi were noted as well as decreased breath sounds bilaterally.   Her lab data showed normal BMP. Liver enzymes were not checked.  The patient's white blood cell count was normal at 6.5, hemoglobin was 10.7 and platelet count 177. EKG showed normal sinus rhythm at 75 beats per minute, normal axis. No acute ST-T changes were noted. No significant change since the 31st of December 2013. Chest x-ray was unremarkable.  HOSPITAL COURSE: The patient was admitted to the hospital. She was started on steroids, antibiotic therapy with Levaquin, as well as  inhalation therapy around the clock and cough suppressants, with Tussionex as well as codeine. Her pain medication, morphine, was advanced to every 3 to 4 hours. With this therapy and steroids as well as oxygen inhalation therapy, she improved somewhat, she felt more comfortable and her cough was lesser now. She was ready to be discharged home, today on the 30th of January 2014. On the day of discharge, she had some expiratory wheezes still; however, her breathing pattern was much better and she did not have significant discomfort or pains. Her vital signs are stable with temperature 97.8, pulse 65, respiration rate was 18, blood pressure ranging from 97 to 112 over 60s and oxygen saturation was 97% to 100% on 3 liters of oxygen through nasal cannula at rest. She is to continue antibiotic therapy as well as steroid taper and inhalation therapy. She is to continue morphine and codeine as well as Tussionex for her cough suppression. She is to follow up with Dr. Freda Munro in the next few days after discharge. She is to follow also with hospice at home.  In regards to bronchitis, we ordered sputum cultures. However, she was never able to cough up anything and as she was somewhat improved with Levaquin will continue this medication. She is to continue this medication to complete course.   In regards to cough, the patient is to continue codeine as needed as well as Tussionex.   For hypertension, hyperlipidemia, history of coronary artery disease, peripheral neuropathy and hypothyroidism the patient is to continue outpatient medications.   In regards to ongoing tobacco abuse, the patient was counseled again and nicotine replacement therapy was initiated.   The patient is being discharged in stable condition with the above-mentioned medications and followup.   TIME SPENT: 40 minutes.  ____________________________ Katharina Caper, MD rv:sb D: 01/05/2013 20:23:17 ET    T: 01/06/2013 08:59:35 ET        JOB#: [id number] cc: Katharina Caper, MD, <Dictator> Yevonne Pax, MD Katharina Caper MD ELECTRONICALLY SIGNED 01/26/2013 18:50

## 2015-03-29 NOTE — H&P (Signed)
PATIENT NAME:  Bridget Crawford, Bridget Crawford MR#:  [id number] DATE OF BIRTH:  [birth year]  DATE OF ADMISSION:  01/08/2013  PRIMARY CARE PHYSICIAN: Lyndon CodeFozia M. Khan, MD.   ED REFERRING PHYSICIAN: Darien Ramusavid W. Kaminski, MD.   CHIEF COMPLAINT: Substernal chest pain.   HISTORY OF PRESENT ILLNESS: The patient is a 69 year old     DICTATION ENDS HERE.  ____________________________ Lacie ScottsShreyang H. Allena KatzPatel, MD shp:eg D: 01/08/2013 16:01:00 ET T: 01/08/2013 22:03:58 ET JOB#: [id number]  cc: Novah Nessel H. Allena KatzPatel, MD, <Dictator> Lyndon CodeFozia M. Khan, MD  Charise CarwinSHREYANG H Trevaughn Schear MD ELECTRONICALLY SIGNED 01/14/2013 8:33

## 2015-03-29 NOTE — Consult Note (Signed)
PATIENT NAME:  Bridget Crawford, Bridget Crawford MR#:  [id number] DATE OF BIRTH:  [birth year]  DATE OF CONSULTATION:  01/08/2013  REFERRING PHYSICIAN: Dr. Allena Katz.  CONSULTING PHYSICIAN:  Bridget Blinks, MD  REASON FOR CONSULTATION: Unstable angina with known coronary artery disease, hypertension with severe chronic obstructive pulmonary disease and wheezing.   CHIEF COMPLAINT: Stabbing pain.   HISTORY OF PRESENT ILLNESS: This is a 69 year old female with known cardiovascular disease with a cardiac catheterization many years prior with minimal three vessel coronary artery disease. She has been treated with appropriate medications and hypertension control, which is excellent. She has severe COPD continuing to smoke 1/2 pack per day with wheezing all the time and home oxygen. The patient has had new onset of substernal chest pain radiating into the back to a significant degree associated with shortness of breath, which was relieved spontaneously as well as with some other issues. She now is chest pain free at rest. The patient also has had an EKG showing normal sinus rhythm with nonspecific ST changes and no evidence of troponin elevation.   REVIEW OF SYSTEMS: The remainder review of systems negative for vision change, ringing in the ears, hearing loss, cough, congestion, heartburn, nausea, vomiting, diarrhea, bloody stools, stomach pain, extremity pain, leg weakness, cramping of the buttocks, no known blood clots, headaches, blackouts, dizzy spells, nosebleeds, congestion, trouble swallowing, frequent urination, urination at night, muscle weakness, numbness, anxiety, depression, skin lesions or skin rashes.   PAST MEDICAL HISTORY:  1.  COPD with dependent oxygen. 2.  Hypertension.  3.  Coronary artery disease.  FAMILY HISTORY: Multiple family members including father and mother having early onset of cardiovascular disease and hypertension as well as coronary disease and myocardial infarction.   SOCIAL HISTORY:  Smokes 1 pack per day and worse in the past. Denies alcohol use.   ALLERGIES/MEDICATIONS: Listed.   PHYSICAL EXAMINATION:  VITAL SIGNS: Blood pressure 136/62 bilaterally, heart rate 72 upright, reclining, and irregular.  GENERAL: She is a well appearing female in no acute distress.  HEENT: No icterus, thyromegaly, ulcers, hemorrhage or xanthelasma.  HEART: Regular rate and rhythm with normal S1, S2. A 2/6 apical murmur consistent with mitral regurgitation. PMI is inferiorly displaced. Carotid upstroke normal without bruit. Jugular venous pressure is normal.  LUNGS: Has significant expiratory wheezes with a few rhonchi.  ABDOMEN: Soft, nontender, without hepatosplenomegaly or masses. Abdominal aorta is normal size without bruit.  EXTREMITIES: 2+ radial, femoral, dorsal pedal pulses with no lower extremity edema, cyanosis, clubbing or ulcers.  NEUROLOGIC: She is oriented to time, place, and person with normal mood and affect.   ASSESSMENT: A 69 year old female with coronary artery disease, hypertension, hyperlipidemia, severe chronic obstructive pulmonary disease with wheezing having chest pain, slightly abnormal EKG with normal troponin and CK-MB, needing further treatment options.   RECOMMENDATIONS:  1.  Continue serial ECG and enzymes to assess for possible myocardial infarction.  2.  Further treatment of severe COPD and wheezing with possible antibiotics, prednisone and supplemental oxygen.  3.  Echocardiogram for significant pulmonary hypertension as well as other cardiopulmonary heart disease causing listed above.  4.  Further consideration of Lexiscan infusion, Myoview to assess for myocardial ischemia and other chest discomfort causes.  5.  Ambulation and follow for any further significant symptoms.  6.  Hypertension control with appropriate antihypertensives including ACE inhibitor.  7.  Further consideration of high intensity statin therapy due to coronary artery disease with a goal  LDL 50% below original. Further treatment options after above.  ____________________________ Bridget BlinksBruce J. Cybele Maule, MD bjk:aw D: 01/09/2013 09:02:42 ET T: 01/09/2013 11:25:43 ET JOB#: [id number]  cc: Bridget BlinksBruce J. Annamay Laymon, MD, <Dictator> Bridget BlinksBRUCE J Woody Kronberg MD ELECTRONICALLY SIGNED 02/03/2013 8:52

## 2015-03-29 NOTE — H&P (Signed)
PATIENT NAME:  Bridget Crawford, Danelia N MR#:  [id number] DATE OF BIRTH:  12/24/1945  DATE OF ADMISSION:  01/02/2013  PRIMARY CARE PHYSICIAN: Dr. Beverely RisenFozia Khan.   PULMONOLOGIST: Dr. Freda MunroSaadat Khan.   REFERRING PHYSICIAN: Dr. Clemens Catholicagsdale.   CHIEF COMPLAINT: Increased shortness of breath.   HISTORY OF PRESENT ILLNESS: The patient is a 69 year old Caucasian female with history of end-stage chronic obstructive pulmonary disease home oxygen dependent, under the care of hospice. The patient has a history of chronic smoking, and she continues to smoke 1 pack a day. She reports getting more short of breath over the last few days, rattling in the chest, wheezing and her shortness of breath worsened to the extent that she was unable to talk. She called the hospice nurse who examined her, and she told her to go to the hospital for evaluation and receiving treatment. She is anticipating to have a bed available tomorrow at hospice home.   REVIEW OF SYSTEMS:   CONSTITUTIONAL: Denies having any fever. No chills but she has some fatigue.  EYES: No blurring of vision. No double vision.  EARS, NOSE, THROAT: No hearing impairment. No sore throat. No dysphagia.  CARDIOVASCULAR: No chest pain, but she has shortness of breath and wheezing and cough. No syncope. No edema.  RESPIRATORY: Shortness of breath, wheezing and chest tightness, cough.  GASTROINTESTINAL: No abdominal pain, No vomiting. No diarrhea.  GENITOURINARY: No dysuria. No frequency of urination.  MUSCULOSKELETAL: No joint pain or swelling. No muscular pain or swelling other than her chronic pain syndrome.  INTEGUMENTARY: No skin rash. No ulcers.  NEUROLOGY: No focal weakness. No seizure activity. No headache.  PSYCHIATRY: No anxiety. No depression.  ENDOCRINE: No polyuria or polydipsia. No heat or cold intolerance.  HEMATOLOGY: No easy bruisability. No lymph node enlargement.   PAST MEDICAL HISTORY: End-stage chronic obstructive pulmonary disease home oxygen dependent  on 2 liters, chronic respiratory failure, systemic hypertension, hyperlipidemia, coronary artery disease, peripheral neuropathy, hypothyroidism.   PAST SURGICAL HISTORY: Bilateral hip surgery and replacement, hysterectomy, partial intestinal resection, ankle surgery and bilateral breast implants.   FAMILY HISTORY: Her mother died of unspecified lung disease at the age of 67 and she had also breast cancer. Her father died at age 67 from heart disease. She has a daughter with interstitial nephritis. She has an aunt on the maternal side who had breast cancer. There is also a family history of leukemia.   SOCIAL HISTORY: She is divorced. Lives at home under the care of hospice. She has a friend whose name is Agnes LawrenceJerry Watkins. He has power of attorney regarding her health issues.   SOCIAL HABITS: Chronic smoker. She started smoking at a young age. She continues to smoke 1 pack a day. No history of alcohol or drug abuse.   ADMISSION MEDICATIONS: Fentanyl patch 25 mcg q.72 hours. Morphine sulfate 15 mg 4 times a day. Zofran 4 mg q.8 hours as needed for nausea. Trazodone 50 mg at night. Toviaz 8 mg once a day. Theophylline 200 mg 1-1/2 tablet (that is 300 mg) once a day. Spiriva 1 inhalation once a day. Pristiq 100 mg, taking 1-1/2 tablets once a day. Omeprazole 40 mg once a day. Norvasc 5 mg a day. Nicotrol inhaler p.r.n. Methocarbamol 750 mg q.8 hours. Lipitor 10 mg a day. Levothyroxine 50 mcg once a day. Atrovent inhaled q.6 hours p.r.n. Gabapentin 300 mg q.8 hours. DuoNebs 4 times a day p.r.n. Dulera 200 mcg 2 puffs twice a day. Daliresp 500 mcg 1 tablet once a  day, Bystolic 5 mg once a day, buspirone 10 mg twice a day, albuterol puffs q.4 hours p.r.n.   ALLERGIES: NEOSPORIN, POLYSPORIN AND DARVOCET-N 100.   PHYSICAL EXAMINATION:  VITAL SIGNS: Blood pressure 118/62, respiratory rate 22, pulse 70, temperature 98.3. Her oxygen saturation is 100% while on oxygen and breathing treatment.  GENERAL APPEARANCE:  Elderly female, thin looking, in bed in sitting position taking breathing treatment. She appears short of breath.  HEAD AND NECK: Mild pallor. No icterus. No cyanosis. Ear examination revealed normal hearing, no discharge, no lesions, no ulcers. Nasal mucosa was normal, no ulcers, no bleeding. Oropharyngeal area was normal without oral thrush, no exudates. She is edentulous, and she is wearing dentures. Eye examination revealed normal eyelids and conjunctivae. Pupils about 6 mm, equal and reactive to light.  NECK: Supple. Trachea at midline. No thyromegaly. No cervical lymphadenopathy. No masses.  HEART: Normal S1, S2. No S3, S4. No murmur. No gallop. No carotid bruits.  RESPIRATORY: Prolonged expiratory phase. Bilateral wheezing, primarily expiratory wheezing. She is using accessory muscles for breathing. No rales. A few scattered rhonchi. Decreased breathing sounds bilaterally.  ABDOMEN: Soft without tenderness. No hepatosplenomegaly. No masses. No hernias.  SKIN: No ulcers. No subcutaneous nodules.  MUSCULOSKELETAL: The patient has mild clubbing of fingers. No joint swelling.  NEUROLOGIC: Cranial nerves II through XII are intact. No focal motor deficit.  PSYCHIATRIC EVALUATION: The patient is alert and oriented x3. Mood and affect were normal.   LABORATORY FINDINGS: EKG showed normal sinus rhythm with rate of 75 per minute. Chest x-ray showed no infiltrates, no consolidation, no effusion. Serum glucose 93, BUN 10, creatinine 0.7, sodium 141, potassium 3.9, calcium 8.1. CBC showed white count of 6500, hemoglobin 10.7, hematocrit 32, platelet count 177.   ASSESSMENT:  1. Severe shortness of breath secondary to acute exacerbation of chronic obstructive pulmonary disease.  2. Chronic respiratory failure, home oxygen dependent.  3. Coronary artery disease.  4. Systemic hypertension.  5. Hypothyroidism.  6. Tobacco abuse.  7. Chronic pain syndrome.  8. Depression and anxiety.  9. Peripheral  neuropathy.  10. Hyperlipidemia.   PLAN: Will admit the patient for observation. Intensify treatment with DuoNebs along with IV Solu-Medrol. Levaquin was given and Tussionex to reduce her cough. Continue oxygen supplementation. Continue home medications, including the narcotics. I will hold the following medications since they are nonformulary in this hospital: These are Pristiq, Bystolic, Toviaz, Daliresp and [employer] inhaler.   Time spent in evaluating this patient took more than 55 minutes, including reviewing her medical records.   ____________________________ Carney Corners. Rudene Re, MD amd:gb D: 01/02/2013 00:43:18 ET T: 01/02/2013 01:20:29 ET JOB#: [id number]  cc: Carney Corners. Rudene Re, MD, <Dictator> Zollie Scale MD ELECTRONICALLY SIGNED 01/02/2013 4:24

## 2015-03-29 NOTE — H&P (Signed)
DATE OF BIRTH:  01/17/1946  PRIMARY CARE PHYSICIAN:  Dr. Beverely Risen.   EMERGENCY DEPARTMENT REFERRING PHYSICIAN:  Dr. Carollee Massed.   CHIEF COMPLAINT:  Chest pain.   HISTORY OF PRESENT ILLNESS:  The patient is a 69 year old white female with a history of end-stage COPD, home O2, under care of hospice, who has a history of chronic smoking, also has a history of coronary artery disease per cath in 2011, which was 30% to 40% stenosis, who was actually hospitalized recently from January 27th and discharged 2 days ago for acute-on-chronic respiratory failure with acute COPD exacerbation, who was doing okay from a respiratory standpoint, but around 12:00 p.m. today, she went to the bathroom and came back, and she started having severe sharp, sharp sternal chest pain. According to the husband, the pain was so severe that she could not talk. Her symptoms lasted for more than an hour. When EMS arrived, they gave her some morphine that did help relieve the pain. She reports no radiation of the pain. She has had occasional pains in the chest, but nothing to this extent. She reports that the breathing is stable since discharge and is unchanged.     PAST MEDICAL HISTORY:   1.  End-stage COPD on home O2 at 2 L. She is currently being followed by home hospice.  2.  Chronic respiratory failure.  3.  Hypertension.  4.  Hyperlipidemia.  5.  Peripheral neuropathy.  6.  Hypothyroidism.  7.  Coronary artery disease with cardiac cath done in February 2011 per note from Dr. Lady Gary in the past, which states that she had 30% to 40% stenosis. Nonocclusive coronary artery disease.   PAST SURGICAL HISTORY:  1.  Bilateral hip replacement.  2.  Status post hysterectomy.  3.  Status post partial intestinal resection.  4.  Ankle surgery.  5.  Status post bilateral breast implants.   CURRENT MEDICATIONS:  She is on albuterol 2 puffs q. 4 to 6 hours as needed, Bactroban applied topically to effected area as needed, buspirone 10  mg 1 tab p.o. b.i.d., Bystolic 5 mg daily, codeine 30 mg q. 4 p.r.n., for cough Daliresp 500 mcg daily, diazepam 10 mg 1 tab p.o. t.i.d., Dulera 200/5 mcg inhalation 2 puffs q. 12, DuoNebs 4 times per day, fentanyl 25 mcg change every 72 hours, gabapentin 300 one tab p.o. q. 8, Levofloxacin 500 one tab p.o. q. 24 this was from a recent discharge, levothyroxine 50 mcg daily, Lipitor 10 daily, methocarbamol 750 one tab p.o. q. 8, morphine 15 mg 1 tab p.o. q. 4 as needed for pain, Nicotrol inhaler 10 mg 1 inhalation q. 2 hours, Norvasc 5 daily, omeprazole 40 daily, prednisone taper, Pristiq 150 mg daily, Spiriva 18 mcg daily, theophylline 300 mcg daily, Toviaz 8 mg daily, trazodone 50 at bedtime p.r.n. for sleep, Zofran 4 mg q. 8 p.r.n.   SOCIAL HISTORY:  The patient lives at home under care of hospice. She is a chronic smoker, continues to smoke 1 pack per day. No history of alcohol or drug use.   FAMILY HISTORY:  Mother died of lung disease. She also had breast cancer. Father died at 31 from heart disease.   ALLERGIES:  NEOSPORIN, POLYSPORIN, and TAPE, as well as DARVOCET-N 100.   REVIEW OF SYSTEMS:  CONSTITUTIONAL:  Denies having any fevers, chills. She does have some chronic fatigue.  EYES:  No blurred vision. No double vision. No cataracts. No glaucoma.  ENT:  No hearing impairment. No sore throat.  No dysphagia.  CARDIOVASCULAR:  Complains of chest pain as above. Has chronic dyspnea on exertion, has hypertension. No syncope.  RESPIRATORY:  Has chronic shortness of breath, wheezing. Has COPD endstage.  GASTROINTESTINAL:  No abdominal pain. No nausea. No vomiting, diarrhea. No hematemesis. No hematochezia.  GENITOURINARY:  Denies any dysuria, frequency, urgency.  MUSCULOSKELETAL:  Denies any joint pain swelling. No gout.  SKIN:  Denies any skin rash. No ulcers.  NEUROLOGIC:  Denies any focal weakness. No seizure activity. No CVA. No TIAs.  ENDOCRINE:  Denies any polyuria, nocturia. No heat or cold  intolerance.  HEMATOLOGIC:  No easy bruisability.  PSYCHIATRIC:  Does have some anxiety.   PHYSICAL EXAMINATION:  VITAL SIGNS:  Temperature 98.9, pulse 78, respirations 16, blood pressure 104/49, O2 of 96%.  GENERAL:  The patient is an elderly white female and currently appears comfortable in no acute distress.  HEENT:  Head atraumatic, normocephalic. Pupils equal, round, reactive to light and accommodation. There is no conjunctival pallor. No scleral icterus. Nasal exam shows no drainage or ulceration. Oropharynx is clear without any exudate.  NECK:  Supple. Trachea is midline. No thyromegaly. No cervical lymphadenopathy. No masses. There is no JVD.  CARDIOVASCULAR:  Regular rate and rhythm. No murmurs, rubs, clicks or gallops. PMI is not displaced.  LUNGS:  She has got bilateral wheezing. There are no rales or rhonchi. No accessory muscle usage currently.  ABDOMEN:  Soft, nontender, nondistended. Positive bowel sounds x 4.  SKIN:  No rash. No ulcers.  MUSCULOSKELETAL:  There is no edema or swelling.  VASCULAR:  Good DP, PT pulses.  NEUROLOGICAL:  Cranial nerves II through XII grossly intact. No focal deficits.  PSYCHIATRIC:  The patient is alert and oriented x 3. Not anxious or depressed currently.   EVALUATIONS:  The patient's glucose was 128, BUN 18, creatinine 0.70, sodium 140, potassium 3.4, chloride 104, CO2 is 27, calcium 8.0. CPK 53, CPK-MB is 1.7, troponin less than 0.02. WBC 6.3, hemoglobin 12.1, platelet count is 263. D-dimer is 0.22. EKG, normal sinus rhythm without any ST-T wave changes.   ASSESSMENT AND PLAN:  The patient is a 69 year old white female with end-stage chronic obstructive pulmonary disease with chronic respiratory failure, who is followed by home hospice, presents with excruciating chest pain.  1.  Chest pain, atypical in nature. Cardiac enzymes negative. EKG shows no acute abnormalities. Her D-dimer is negative to suggest high probability of PE. At this time, we  will place her under observation, check serial cardiac enzymes, try to control her pain. She is not a candidate for any type of stress test due to her pulmonary status. She is DO NOT RESUSCITATE, but does want to stay in the hospital for further evaluation. I will ask [facility] to see the patient. They have seen her in the past. Likely, she will just need medical management if her symptoms persist.  2.  Chronic obstructive pulmonary disease without any exacerbation. We will continue oxygen, continue Daliresp, continue her inhalers, continue prednisone taper as recently taking, as well as Levaquin that she was on.  3.  Hypertension. We will continue Norvasc.  4.  Hypothyroidism. We will continue Synthroid.  5.  Mild hypokalemia. We will replace her potassium.  6.  Hyperlipidemia. Continue Lipitor. We will check a fasting lipid panel in the morning.  7.  Anxiety. We will continue buspiro   TIME SPENT:  40 minutes.      ____________________________ Lacie Scotts Allena Katz, MD shp:ms D: 01/08/2013 16:11:51  ET T: 01/08/2013 21:48:45 ET JOB#: [id number]  cc: Deep Bonawitz H. Allena KatzPatel, MD, <Dictator> Charise CarwinSHREYANG H Arnice Vanepps MD ELECTRONICALLY SIGNED 01/14/2013 8:29

## 2015-03-31 NOTE — H&P (Signed)
PATIENT NAME:  Bridget Crawford, [name] MR#:  [id number] DATE OF BIRTH:  1946/08/19  DATE OF ADMISSION:  01/20/2012  REFERRING PHYSICIAN: Dr. Freda MunroSaadat Khan   PRIMARY CARE PHYSICIAN: Dr. Beverely RisenFozia Khan    REASON FOR ADMISSION: Respiratory failure, failed outpatient antibiotics and prednisone.   HISTORY OF PRESENT ILLNESS: The patient is a 69 year old white female with past medical history of hypertension, hyperlipidemia, chronic obstructive pulmonary disease, oxygen dependent, quit smoking four months ago who over the last week has been having worsening shortness of breath. She saw her pulmonologist, Dr. Freda MunroSaadat Khan, who started her on Levaquin orally as well as oral prednisone. She continued to worsen and had shortness of breath. She saw him two days ago. She called him today stating that she is just worse. He referred her here for direct admission. She has been weak, increased short of breath, coughing but no phlegm. No chest pain. She has subjective chills and shakes but no documented fever. No nausea, vomiting, or diarrhea. She is weak and she said that she is using 3 liters of oxygen but she usually uses 2. We are asked to admit the patient for respiratory failure, probably COPD exacerbation.   PAST MEDICAL HISTORY:  1. Chronic obstructive pulmonary disease, on 2 to 3 liters of oxygen.  2. Hypertension.  3. Hyperlipidemia.  4. Chronic pain syndrome, on chronic morphine. 5. Hypothyroidism. 6. Peripheral neuropathy.  7. Depression/anxiety. 8. History of coronary artery disease.   PAST SURGICAL HISTORY:  1. Partial intestinal resection. 2. Breast implants.  3. Ankle surgery.  4. Hysterectomy.   DRUG ALLERGIES: Codeine and Darvocet.   MEDICATIONS AT HOME:  1. Levaquin 750 mg daily. 2. Prednisone taper. 3. Albuterol inhaled 2 puffs q.4 hours.  4. Bactroban 2% cream apply b.i.d.  5. Bystolic 5 mg daily.  6. Daliresp 500 mcg daily.  7. Diazepam 10 mg half-tablet b.i.d. p.r.n.  8. DuoNebs q.4 hours  p.r.n.  9. Gabapentin 300 mg p.o. q.8. 10. Ipratropium bromide one SVN q.6. 11. Levothyroxine 0.05 mg p.o. daily. 12. Lipitor 10 mg daily. 13. Methocarbamol 750 mg p.o. q.8. 14. Nicotrol cartridge inhaler as directed. 15. Norvasc 5 mg daily.  16. Oxygen 2 liters. 17. Phenergan 25 mg p.o. b.i.d. p.r.n. nausea. 18. Pristiq 50 mg 2 capsules p.o. daily. 19. Soma 250 mg p.o. q.8 hours. 20. Theophylline ER 200 mg daily.  21. Spiriva 18 mcg 1 puff daily. 22. Morphine 15 mg p.o. q.4 hours.   SOCIAL HISTORY: She used to smoke a pack a day, quit four months ago, is down to five cigarettes a day in the last year. She has been smoking since a young age. She lives with her daughter and granddaughter who are also smokers so she is exposed to secondhand smoke. 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 Is divorced.   FAMILY HISTORY: Daughter with interstitial nephritis. Father died at age 67 of heart disease. Mother died of unspecified lung disease at age 67.   REVIEW OF SYSTEMS: CONSTITUTIONAL: Subjective fever and fatigue. EYES: No blurred vision, double vision, pain, redness, inflammation, glaucoma. She does wear glasses. ENT: No tinnitus, ear pain, hearing loss, epistaxis, discharge. RESPIRATORY: Cough, wheezing. No hemoptysis. She has dyspnea but no asthma or painful respirations. CARDIOVASCULAR: No chest pain, orthopnea, edema, arrhythmia. She has dyspnea on exertion. No palpitations or syncope. GI: Nausea but no vomiting, diarrhea, hematemesis, melena, or gastroesophageal reflux disease. GU: No dysuria, hematuria, renal calculi, incontinence. GYN: No breast mass, tenderness, or vaginal discharge. She is  postmenopausal. ENDOCRINE: No polyuria, nocturia, thyroid problems, increased sweating, heat or cold intolerance. HEME/LYMPH: No anemia, easy bruising, or swollen glands. INTEGUMENTARY: No acne, rash, change in mole, hair or skin. MUSCULOSKELETAL: No pain in back, shoulder, knee, hip, arthritis, swelling, gout.  NEUROLOGIC: No numbness, weakness, epilepsy, tremor, vertigo, ataxia. PSYCH: No anxiety, insomnia, ADD, bipolar, depression.   PHYSICAL EXAMINATION:   VITAL SIGNS: Temperature 98.5, heart rate 88, respiratory rate 22, blood pressure 169/89, sating 95% on 3 liters.   GENERAL: The patient is well developed, well nourished in mild respiratory distress, cachectic.   HEENT: Pupils equal, reactive to light and accommodation. Extraocular movements intact. Anicteric sclerae. No difficulty hearing. Moist mucous membranes.   NECK: No JVD. No thyromegaly. No lymphadenopathy. No carotid bruits.   LUNGS: Bilateral wheezing, use of accessory muscles, increased effort, resonant to percussion.   CARDIOVASCULAR: Regular rate and rhythm. Normal S1, S2. No murmurs, rubs, or gallops appreciated. 2+ dorsalis pedis pulses. Non-lateralized PMI.  EXTREMITIES: No lower extremity edema.   BREASTS: No obvious masses.   ABDOMEN: Soft, nontender, nondistended. Positive bowel sounds.   GU: Deferred.   MUSCULOSKELETAL: Strength 5/5. No clubbing, cyanosis, or degenerative joint disease. Grade II clubbing.    SKIN: No rashes, lesions, or induration. Warm to touch.  LYMPH: No lymphadenopathy in the cervical or supraclavicular area.   NEUROLOGIC: Cranial nerves II through XII are intact. No focal deficit. Follows commands. No dysphagia, dysarthria, or contractures.   PSYCH: Alert and oriented x3.   LABORATORY, DIAGNOSTIC, AND RADIOLOGICAL DATA: None.  ASSESSMENT AND PLAN: This is a pleasant 69 year old white female with history of COPD, hypertension, acute on chronic respiratory failure secondary to chronic obstructive pulmonary disease exacerbation.  1. Respiratory failure due to COPD. Failed Levaquin and oral prednisone. Will put on IV Solu-Medrol, Invanz, and azithromycin until we get chest x-ray and labs. We will add Symbicort, Spiriva, Daliresp. Also continue Theo-Dur. We have consulted Dr. Freda Munro. In  addition, will get ABG.  2. Hypertension. Continue amlodipine, Bystolic. May need to consider holding this as this is a beta-blocker.   3. Hypothyroidism. Will continue levothyroxine.  4. Hyperlipidemia. Continue statin.  5. Chronic pain. Continue home morphine.  6. Smoking. The patient says she quit four months ago but she has nicotine stains on her hands. Counseled about continued cessation for over 3 minutes and will continue Pristiq.  7. DVT prophylaxis. Maintain with Lovenox.  8. GI prophylaxis with Protonix.   CODE STATUS: DO NOT RESUSCITATE. The patient brought in the yellow sheet stating this.    TOTAL TIME SPENT ON ADMISSION: 50 minutes. Discussed with Dr. Freda Munro and the patient.  ____________________________ Corie Chiquito. Lafayette Dragon, MD aaf:drc D: 01/20/2012 14:39:52 ET T: 01/20/2012 15:52:21 ET JOB#: [id number]  cc: Karolee Ohs A. Lafayette Dragon, MD, <Dictator>, Lyndon Code, MD Karolee Ohs Laverda Page MD ELECTRONICALLY SIGNED 01/21/2012 7:20

## 2015-03-31 NOTE — Discharge Summary (Signed)
PATIENT NAME:  Bridget Crawford, Bridget Crawford MR#:  [id number] DATE OF BIRTH:  [birth year]  DATE OF ADMISSION:  01/20/2012 DATE OF DISCHARGE:  01/23/2012  ADMITTING PHYSICIAN: Dr. Larena Glassman.   DISCHARGING PHYSICIAN: Dr. Enid Baas.  PRIMARY CARE PHYSICIAN: Dr. Beverely Risen. PRIMARY PULMONOLOGIST: Dr. Freda Munro.   CONSULTATIONS IN THE HOSPITAL: Pulmonary consultation by Dr. Freda Munro.   DISCHARGE DIAGNOSES: 1. Acute on chronic respiratory failure secondary to chronic obstructive pulmonary disease exacerbation.  2. History of chronic respiratory failure secondary to emphysema and chronic obstructive pulmonary disease on two liters home oxygen.  3. Acute bronchitis.  4. Hypertension.  5. Osteoporosis.  6. Hyperlipidemia.  7. Chronic low back pain.  8. Hypothyroidism.   DISCHARGE HOME MEDICATIONS:  1. Zofran 4 mg p.o. every 4 to 6 hours p.r.n. for nausea.  2. Spiriva 1 capsule inhalation daily. 3. Bystolic 5 mg p.o. daily.  4. Levothyroxine 50 mcg p.o. daily.  5. DuoNebs every six hours as needed.  6. 2 liters home oxygen.  7. Senna Colace 8.6/50 mg 2 tablets daily at bedtime.  8. Soma 250 mg p.o. 4 times daily. 9. Daliresp 500 mcg daily.  10. Atorvastatin 10 mg p.o. daily.  11. Theophylline 200 mg p.o. daily.  12. Dulera every 12 hours.  13. MiraLAX p.r.n. for constipation.  14. Diazepam 10 mg p.o. t.i.d.  15. Gabapentin 300 mg p.o. t.i.d.  16. Norvasc 5 mg p.o. daily.  17. Pristiq 100 mg p.o. daily.  18. ProAir inhaler four times daily as needed.  19. Promethazine 12.5 mg p.o. every six hours p.r.n. for nausea. 20. Tussionex 5 milliliters every twelve hours as needed for cough.  21. Prednisone taper.  22. Azithromycin 250 mg p.o. daily for three days.  23. Morphine sulfate immediate release tablets 15 mg p.o. q.8 hours p.r.n. for pain.   DISCHARGE DIET: Low-sodium diet.   DISCHARGE ACTIVITY: As tolerated.    FOLLOWUP INSTRUCTIONS:  1. Follow up with primary care physician  in 1 to 2 weeks.  2. Follow up with pulmonologist, Dr. Freda Munro, in one week.   LABS AT THE TIME OF DISCHARGE: WBC 6.2, hemoglobin 12.8, hematocrit 38.1, platelet count 293. Sodium 139, potassium 4.2, chloride 103, bicarbonate 32, BUN 10, creatinine 0.6, glucose 86, calcium 8.9. Blood cultures have remained negative while in the hospital. ABG showing pH of 7.39, pCO2 51, bicarbonate 30, pO2 90, bicarbonate 31, sats 98% on 2 liters nasal cannula. Urinalysis negative for any infection. Chest x-ray showing hyperinflation consistent with chronic obstructive pulmonary disease. No evidence of congestive heart failure or pneumonia seen present.   BRIEF HOSPITAL COURSE: Bridget Crawford is a 69 year old female with chronic respiratory failure on 2 liters home oxygen, hypertension, hyperlipidemia, chronic pain issues who has been having some bronchitis symptoms and dyspnea on exertion as an outpatient. She was started on oral prednisone and Levaquin by Dr. Welton Flakes as an outpatient without any improvement. Her symptoms started worsening so she was admitted to the hospital.  1. Acute respiratory failure on top of chronic respiratory failure. She was  started on IV steroids while in the hospital and also on azithromycin for her bronchitis as she failed Levaquin as an outpatient. She was on neb treatments and Spiriva. Symptoms started gradually improving and she is back to baseline two liters of home oxygen with ambulation with same level of dyspnea at baseline. She is being discharged on prednisone taper along with azithromycin.  2. Hypertension. Her home medications have been continued. She was on  Bystolic and also Norvasc.  3. Chronic back pain, osteoporosis. She is on morphine sulfate immediate release q.8 hours. She is on soma. She is on Diazepam, Gabapentin. She can follow-up with her PCP for further management.  4. Hypothyroidism. She is on Synthroid, which is being continued.  5. Her CODE STATUS is DO NOT RESUSCITATE  from admission notes and out of facility signed DO NOT RESUSCITATE form in the chart.   DISCHARGE CONDITION: Stable.   DISCHARGE DISPOSITION: Home.   TIME SPENT ON DISCHARGE: 40 minutes.    ____________________________ Enid Baasadhika Olen Eaves, MD rk:ap D: 01/25/2012 15:01:59 ET T: 01/26/2012 10:38:19 ET JOB#: [id number]  cc: Enid Baasadhika Wynonia Medero, MD, <Dictator> Yevonne PaxSaadat A. Khan, MD Lyndon CodeFozia M. Khan, MD Enid BaasADHIKA Dave Mergen MD ELECTRONICALLY SIGNED 01/27/2012 11:55

## 2015-03-31 NOTE — H&P (Signed)
PATIENT NAME:  Bridget Crawford, Bridget Crawford MR#:  [id number] DATE OF BIRTH:  1946/11/06  DATE OF ADMISSION:  02/26/2012  PRIMARY CARE PHYSICIAN: Dr. Freda MunroSaadat Khan.  REFERRING PHYSICIAN: Dr. Clemens Catholicagsdale.   CHIEF COMPLAINT: Worsening shortness of breath, cough, and wheezing for two weeks.  HISTORY OF PRESENT ILLNESS: This is a 69 year old Caucasian female with multiple medical history who presented to the ED with above chief complaints.  The patient has a past medical history of chronic obstructive pulmonary disease on 2 to 3 liters of oxygen, hypertension, hyperlipidemia, chronic pain syndrome, hypothyroidism, peripheral neuropathy, depression, anxiety, and coronary artery disease.  She is alert, awake, oriented in no acute distress. She said she has had shortness of breath with a dry cough and wheezing for the past two weeks. She used nebulizer, but it did not work. She went to doctor's office yesterday, was given a steroid injections and went back home; however, today she developed shortness of breath and wheezing. Again, she called her doctor's office and her primary care physician asked her to call 911 to come to ED for further evaluation and treatment. The patient denies any fever or chills but has dizziness, weakness. She denies any chest pain, palpitation, orthopnea, or nocturnal dyspnea. No abdominal pain, nausea, vomiting, or diarrhea, but she complains of back pain.  She was treated with a morphine and Zithromax the EMERGENCY DEPARTMENT. Now she is on 3 liters oxygen.  PAST MEDICAL HISTORY: As mentioned above.   PAST SURGICAL HISTORY: Partial intestine resection, breast implants, ankle surgery, hysterectomy.   MEDICATIONS: Norvasc at 5 mg p.o. daily, Bystolic 5 mg p.o. daily, Daliresp 500 mcg p.o. daily, diazepam 10 mg p.o. t.i.d., docusate and senna 15 mg/8.6 mg p.o. b.i.d., Dulera 200 mcg/5 mcg inhalation 2 puffs q.12 hours, duoneb inhalation 6 times daily as needed, gabapentin 300 mg p.o. t.i.d.,   levothyroxine 50 mcg p.o. daily, Morphine 15 mg p.o. q.6 hours p.r.Crawford., Nitrostat 0.4 mg sublingual every five minutes up to three doses p.r.Crawford. for chest pain, Pristiq 100 mg p.o. daily. ProAir HFA 90 mcg inhalation 4 times daily p.r.Crawford., promethazine 12.5 mg p.o. q.6 hours p.r.Crawford., Soma 250 mg p.o. 4 times p.r.Crawford., Spiriva 18 mcg inhalation 1 capsule once daily, ? 200 mg p.o. 1.5 tablets 300 mg p.o. daily.  ALLERGIES: Darvocet, Neosporin, Polysporin, and tape.   REVIEW OF SYSTEMS: CONSTITUTIONAL: The patient denies any fever or chills but has dizziness and weakness.   EYES: No blur vision, double vision, or cataract.  ENT: No ear pain, discharge, or epistaxis.  RESPIRATORY: Positive for cough, wheezing, and shortness of breath but no sputum or hemoptysis.   CARDIOVASCULAR: No chest pain, palpitation, orthopnea, or nocturnal dyspnea. No leg edema.   GI: No abdominal pain, nausea, vomiting, or diarrhea. No melena or bloody stool.   GU: No dysuria or hematuria.   SKIN: No rash or jaundice.   NEUROLOGY: No syncope, loss of consciousness or seizure.   HEMATOLOGY: No easy bruising or bleeding.   FAMILY HISTORY: Father died at age of 644 of heart disease. Mother died of nonspecific lung disease at age 67.   SOCIAL HISTORY: The patient denies smoking, but she said she lighted her cigarettes and put bedside, but she denies smoking. Denies any alcohol drinking or illicit drugs. She said she lives with her daughter who is a smoker.    PHYSICAL EXAMINATION: VITALS: Temperature 98.2, blood pressure 99/67, pulse 88, saturation 98% on oxygen, respirations 24.   GENERAL: The patient is alert, awake, oriented  in no acute distress, on oxygen by nasal cannula.   HEENT: Pupils round, equal, reactive to light and accommodation.   NECK: Supple. No JVD or carotid bruit. No lymphadenopathy. No thyromegaly. Moist oral mucosa. Clear oropharynx.   CARDIOVASCULAR: S1, S2 regular rate and rhythm. No murmurs or  gallops.   PULMONARY: Bilateral air entry. Bilateral severe expiratory wheezing but no rales or crackles. Mild use of muscles to breathe.   ABDOMEN: Soft. No distention. No tenderness. No organomegaly. Bowel sounds present.   EXTREMITIES: No edema, clubbing, or cyanosis. No calf tenderness. Bilateral strong pedal pulses.   SKIN: No rash or jaundice.   NEUROLOGY: Alert and oriented x3. No focal deficits. Deep tendon reflexes 2+. Power 5 out of 5. Sensation intact.  LABORATORY, DIAGNOSTIC AND RADIOLOGICAL DATA: Glucose 87, BUN 6, creatinine 0.55. Electrolytes are normal. WBC 3.9 hemoglobin 12.8, platelets 250,000. Chest x-ray: Chronic obstructive pulmonary disease with fibrotic changes. No acute process. theophylline level is less than 2.0. EKG shows normal sinus rhythm at 79 beats per minute.  IMPRESSION: 1. Chronic obstructive pulmonary disease exacerbation.  2. Hypertension.  3. Chronic pain syndrome.  4. Hypothyroidism.  5. Peripheral neuropathy.  6. Depression/anxiety.  7. History of coronary artery disease.   PLAN OF TREATMENT: 1. Patient will be admitted to medical floor. We will continue telemetry monitor. Continue O2 by nasal cannula.  2. We will give Solu-Medrol IVPB and DuoNeb p.r.Crawford. In addition will continue Spiriva, Daliresp for chronic obstructive pulmonary disease.  3. Continue Norvasc and Bystolic for hypertension.  4. Continue other home medications.  5. GI and deep vein thrombosis prophylaxis.   TIME SPENT: About 65 minutes     ____________________________ Shaune Pollack, MD qc:kma D: 02/26/2012 17:07:17 ET T: 02/27/2012 07:23:05 ET JOB#: [id number]  cc: Shaune Pollack, MD, <Dictator> Shaune Pollack MD ELECTRONICALLY SIGNED 02/27/2012 14:19

## 2015-03-31 NOTE — Discharge Summary (Signed)
PATIENT NAME:  Bridget Crawford, Bridget Crawford MR#:  [id number] DATE OF BIRTH:  1946-01-29  DATE OF ADMISSION:  02/26/2012 DATE OF DISCHARGE:  03/03/2012  PRIMARY CARE PHYSICIAN: Dr. Beverely RisenFozia Khan. PRIMARY PULMONOLOGIST: Dr. Freda MunroSaadat Khan.  PRIMARY PAIN MANAGEMENT SPECIALIST: Dr. Ronita HippsFarooque Khan.    REASON FOR ADMISSION: Shortness of breath, cough, and wheezing.   DISCHARGE DIAGNOSES: 1. Acute chronic obstructive pulmonary disease exacerbation. 2. Acute bronchitis.  3. Chronic respiratory failure with oxygen-dependent chronic obstructive pulmonary disease. 4. Leukocytosis, felt to be steroid induced.  5. History of hypertension. 6. Hypomagnesemia, resolved.  7. History of coronary artery disease. 8. History of hypothyroidism with multiple hospital admissions for exacerbations. 9. Ongoing tobacco abuse.  10. History of depression/anxiety. 11. History of chronic pain syndrome.   DISCHARGE DISPOSITION: Home.   DISCHARGE MEDICATIONS: 1. Oxygen at 2 liters per minute via nasal cannula continuously.  2. Prednisone taper as written on prescription.  3. Omnicef 300 mg p.o. every 12 hours x1 day.  4. MSIR 15 mg p.o. every four hours until further advised by her pain management specialist. Mucinex 600 mg p.o. every 12 hours x5 days.  5. Multivitamin 1 tablet daily.  6. Aspirin 81 mg p.o. daily.  7. Nitrostat 0.4 mg sublingually every five minutes x3 p.r.Crawford. chest pain.  8. Diazepam 10 mg p.o. t.i.d.  9. Pristiq 100 mg p.o. daily. 10. Daliresp 500 micrograms daily.  11. Docusate Senna 50 mg/0.6 mg two tablets p.o. every other day.  12. DuoNebs one dose inhaled every four hours p.r.Crawford.  13. Gabapentin 300 mg p.o. t.i.d.  14. Levothyroxine 50 mcg p.o. daily. 15. ProAir HFA 90 mcg inhaled 2 puffs q.i.d. p.r.Crawford.  16. Promethazine 12.5 milligrams p.o. every six hours p.r.Crawford. nausea and vomiting.  17. Soma 250 mg p.o. q.i.d. p.r.Crawford.  18. Spiriva HandiHaler one cap inhaled daily.  19. Theophylline extended release 300  mg p.o. daily 20. Dulera 200 mcg/5 mcg 2 puffs inhaled every 12 hours.   DISCHARGE CONDITION: Improved, stable.   DISCHARGE ACTIVITY: As tolerated.   DISCHARGE DIET: Low sodium, low fat, low cholesterol.    DISCHARGE INSTRUCTIONS:  1. Take medications as prescribed.  2. Return to the Emergency Department for recurrence of symptoms or for worsening shortness of breath, cough, or wheezing or development of any chest pain.   FOLLOW-UP INSTRUCTIONS: 1. Follow-up with Dr. Beverely RisenFozia Khan and Dr. Freda MunroSaadat Khan within one to two weeks. 2. Follow-up with Dr. Ronita HippsFarooque Khan on 03/13/2012. Call office prior to your appointment date and time to verify time.   CONSULTATIONS: None.   PROCEDURES: Chest x-ray PA and lateral 01/29/2012: Chronic obstructive pulmonary disease with fibrotic changes, otherwise no acute cardiopulmonary abnormalities are noted.   PERTINENT LABORATORY DATA: CBC normal on admission WBC count 3.9 AND WBC count rose to max level of 13.7 on 02/29/2012 with normal WBC count at 8.8 at the time of  discharge. Serum theophylline level was less than 2. Magnesium level 1.8 on admission, 1.6 on 02/27/2012 and 1.9 on 02/29/2012. Complete metabolic panel normal on admission except for total protein slightly low at 6.3. EKG on admission was normal sinus rhythm, heart rate 82 beats per minute with normal axis, normal interval with nonspecific ST and T wave abnormality. No acute ischemic change is noted.   BRIEF HISTORY/HOSPITAL COURSE:  1. The patient is a 69 year old female with extensive past medical history including chronic respiratory failure with oxygen-dependent chronic obstructive pulmonary disease with multiple hospital admissions for acute exacerbations of her chronic obstructive pulmonary  disease with ongoing tobacco abuse, hypertension, hyperlipidemia, coronary artery disease, hypothyroidism, depression and anxiety who presented to the Emergency Department secondary to shortness of breath,  cough, and wheezing which would worsen symptoms, not responding much to her nebulizers and MDIs at home. She underwent a chest x-ray in the ER which was negative for acute cardiopulmonary abnormalities and was suggestive of chronic obstructive pulmonary disease with underlying fibrotic change. She was felt to have acute bronchitis leading to acute chronic obstructive pulmonary disease exacerbation. This was managed in standard fashion and she was maintained on supplemental oxygen via nasal cannula at her baseline oxygen requirements and had adequate oxygen saturations. She was initially placed on IV steroids and IV antibiotics in addition to bronchodilator support with nebulizers and MDIs in addition to theophylline and was also maintained on Daliresp, Dulera and Spiriva and to all of these measures the patient showed good response with significant improvement of her overall clinical status. With these measures, her shortness of breath and wheezing have now improved back to her baseline and her cough has also significantly improved. Coughs appeared to be nonproductive. Therefore, sputum specimen was not sent to the lab for analysis. Once her condition was noted to have improved, she was transitioned off IV steroids onto an oral prednisone taper which she will continue and complete as an outpatient. Her IV antibiotics were switched to p.o. route and she has completed six days of inpatient IV antibiotics with Rocephin and has also completed five days of azithromycin. She will be discharged home with one more day of p.o. Omnicef to complete a seven-day course for her bronchitis.  2. In regards to her chronic obstructive pulmonary disease and chronic respiratory failure, she will follow-up with her pulmonologist, Dr. Freda Munro, closely as an outpatient. Otherwise, the patient's current and comorbid conditions remained stable. She was noted to have some leukocytosis which is felt to be steroid-induced as her white  blood cell count was normal on admission and she had remained afebrile. Once her steroids started to be tapered, her white blood cell count normalized. 3. Hypertension. Blood pressure well controlled. The patient is to continue Bystolic and  Norvasc which she was receiving as an inpatient. 4. Hypomagnesemia. The patient received magnesium supplementation and serum magnesium level has normalized.  5. Coronary artery disease. The patient was without any chest pain. This was stable and she was maintained on beta blocker therapy and was also restarted on aspirin and her LDL is at goal.  6. Hypothyroidism. The patient is to continue Synthroid.  7. Hyperlipidemia. The patient's LDL is at goal. She is on dietary control of her hyperlipidemia and she was advised to continue low fat, low cholesterol diet as an outpatient.  8. Depression and anxiety. The patient is to continue Pristiq and she was also advised to continue Valium as prescribed by her pain management specialist.  9. Chronic pain syndrome. I did get a chance to speak to the patient's pain management specialist, Dr. Ronita Hipps, whose help was greatly appreciated and he recommended keeping the patient on MSIR 15 mg p.o. every four hours schedule and monitoring her clinical status closely and to watch for central nervous system and respiratory depression. The patient tolerated scheduled MSIR every four hours well and had no hemodynamic stability or any respiratory compromise and no bradypneic episodes. She will need to follow up with Dr. Ronita Hipps closely as an outpatient and has an appointment scheduled on 03/13/2012 and she was instructed to bring all of her  pain medications as well as her prescriptions to the office visit with Dr. Ronita Hipps. Dr. Welton Flakes also recommended keeping the patient on the same dose of Valium that she was taking as before.  10. Tobacco abuse. The patient was strongly counseled on the importance of smoking cessation.   On  03/03/2012, the patient was hemodynamically stable with significant improvement of her shortness of breath and wheezing back to baseline and also significant improvement of her cough and she was felt to be stable for discharge home with close outpatient follow-up to which the patient was agreeable.   TIME SPENT WITH DISCHARGE: Greater than 30 minutes.    ____________________________ Elon Alas, MD knl:ap D: 03/07/2012 22:03:00 ET T: 03/08/2012 13:57:26 ET JOB#: [id number]  cc: Lyndon Code, MD Yevonne Pax, MD Ronita Hipps, MD Elon Alas, MD, <Dictator> [address] Jazz Biddy MD ELECTRONICALLY SIGNED 03/16/2012 22:02
